# Patient Record
Sex: Female | Born: 1995 | Race: Black or African American | Hispanic: No | Marital: Single | Smoking: Never smoker
Health system: Southern US, Community
[De-identification: ages and names within clinical notes are randomized; demographics above are authoritative.]

## PROBLEM LIST (undated history)

## (undated) DIAGNOSIS — Z789 Other specified health status: Secondary | ICD-10-CM

## (undated) DIAGNOSIS — B999 Unspecified infectious disease: Secondary | ICD-10-CM

## (undated) DIAGNOSIS — R200 Anesthesia of skin: Secondary | ICD-10-CM

## (undated) DIAGNOSIS — A5901 Trichomonal vulvovaginitis: Secondary | ICD-10-CM

## (undated) DIAGNOSIS — A749 Chlamydial infection, unspecified: Secondary | ICD-10-CM

## (undated) DIAGNOSIS — R202 Paresthesia of skin: Secondary | ICD-10-CM

## (undated) HISTORY — DX: Anesthesia of skin: R20.0

## (undated) HISTORY — PX: BREAST SURGERY: SHX581

## (undated) HISTORY — DX: Paresthesia of skin: R20.2

---

## 2014-10-03 ENCOUNTER — Encounter (HOSPITAL_COMMUNITY): Payer: Self-pay | Admitting: Cardiology

## 2014-10-03 ENCOUNTER — Emergency Department (HOSPITAL_COMMUNITY)
Admission: EM | Admit: 2014-10-03 | Discharge: 2014-10-03 | Disposition: A | Discharge status: Home / Self Care | Payer: Medicaid Other | Attending: Emergency Medicine | Admitting: Emergency Medicine

## 2014-10-03 DIAGNOSIS — Z79899 Other long term (current) drug therapy: Secondary | ICD-10-CM | POA: Insufficient documentation

## 2014-10-03 DIAGNOSIS — Z792 Long term (current) use of antibiotics: Secondary | ICD-10-CM | POA: Diagnosis not present

## 2014-10-03 DIAGNOSIS — Z880 Allergy status to penicillin: Secondary | ICD-10-CM | POA: Diagnosis not present

## 2014-10-03 DIAGNOSIS — Z3202 Encounter for pregnancy test, result negative: Secondary | ICD-10-CM | POA: Insufficient documentation

## 2014-10-03 DIAGNOSIS — R509 Fever, unspecified: Secondary | ICD-10-CM | POA: Insufficient documentation

## 2014-10-03 DIAGNOSIS — R109 Unspecified abdominal pain: Secondary | ICD-10-CM | POA: Diagnosis not present

## 2014-10-03 LAB — COMPREHENSIVE METABOLIC PANEL
ALK PHOS: 84 U/L (ref 39–117)
ALT: 13 U/L (ref 0–35)
AST: 24 U/L (ref 0–37)
Albumin: 4.1 g/dL (ref 3.5–5.2)
Anion gap: 11 (ref 5–15)
BILIRUBIN TOTAL: 1 mg/dL (ref 0.3–1.2)
BUN: <5 mg/dL — ABNORMAL LOW (ref 6–23)
CHLORIDE: 104 mmol/L (ref 96–112)
CO2: 24 mmol/L (ref 19–32)
Calcium: 9.6 mg/dL (ref 8.4–10.5)
Creatinine, Ser: 0.78 mg/dL (ref 0.50–1.10)
GLUCOSE: 123 mg/dL — AB (ref 70–99)
POTASSIUM: 3.4 mmol/L — AB (ref 3.5–5.1)
SODIUM: 139 mmol/L (ref 135–145)
Total Protein: 7 g/dL (ref 6.0–8.3)

## 2014-10-03 LAB — URINALYSIS, ROUTINE W REFLEX MICROSCOPIC
Bilirubin Urine: NEGATIVE
Glucose, UA: NEGATIVE mg/dL
Hgb urine dipstick: NEGATIVE
Ketones, ur: >80 mg/dL — AB
Leukocytes, UA: NEGATIVE
NITRITE: NEGATIVE
Protein, ur: NEGATIVE mg/dL
SPECIFIC GRAVITY, URINE: 1.017 (ref 1.005–1.030)
Urobilinogen, UA: 1 mg/dL (ref 0.0–1.0)
pH: 7.5 (ref 5.0–8.0)

## 2014-10-03 LAB — CBC WITH DIFFERENTIAL/PLATELET
Basophils Absolute: 0 10*3/uL (ref 0.0–0.1)
Basophils Relative: 0 % (ref 0–1)
EOS ABS: 0 10*3/uL (ref 0.0–0.7)
Eosinophils Relative: 0 % (ref 0–5)
HEMATOCRIT: 37.1 % (ref 36.0–46.0)
Hemoglobin: 12.9 g/dL (ref 12.0–15.0)
LYMPHS ABS: 1.7 10*3/uL (ref 0.7–4.0)
LYMPHS PCT: 17 % (ref 12–46)
MCH: 31.5 pg (ref 26.0–34.0)
MCHC: 34.8 g/dL (ref 30.0–36.0)
MCV: 90.5 fL (ref 78.0–100.0)
MONO ABS: 1.1 10*3/uL — AB (ref 0.1–1.0)
Monocytes Relative: 11 % (ref 3–12)
Neutro Abs: 7.1 10*3/uL (ref 1.7–7.7)
Neutrophils Relative %: 72 % (ref 43–77)
PLATELETS: 197 10*3/uL (ref 150–400)
RBC: 4.1 MIL/uL (ref 3.87–5.11)
RDW: 12.4 % (ref 11.5–15.5)
WBC: 9.9 10*3/uL (ref 4.0–10.5)

## 2014-10-03 LAB — POC URINE PREG, ED: PREG TEST UR: NEGATIVE

## 2014-10-03 MED ORDER — NAPROXEN 500 MG PO TABS: 500.0000 mg | ORAL_TABLET | Freq: Two times a day (BID) | ORAL | Status: DC

## 2014-10-03 NOTE — ED Provider Notes (Signed)
CSN: [id number]     Arrival date & time 10/03/14  1758 History   First MD Initiated Contact with Patient 10/03/14 1827     Chief Complaint  Patient presents with  . Flank Pain     (Consider location/radiation/quality/duration/timing/severity/associated sxs/prior Treatment) Patient is a 19 y.o. female presenting with flank pain. The history is provided by the patient.  Flank Pain This is a new problem. The current episode started in the past 7 days. The problem occurs constantly. The problem has been gradually worsening. Associated symptoms include abdominal pain, chills and a fever. Pertinent negatives include no nausea or vomiting.   Patricia Greer is a 19 y.o. female who presents to the ED with flank pain. She states she was dx with a UTI 4 days ago and started on Macrobid. She was also treated for bacterial vaginosis and yeast infection.  Her symptoms are worse and her doctor called her today and told her the culture shows that she needs a different antibiotic. Patient has not been to the pharmacy to get the Rx and not sure what it is.    History reviewed. No pertinent past medical history. History reviewed. No pertinent past surgical history. History reviewed. No pertinent family history. History  Substance Use Topics  . Smoking status: Never Smoker   . Smokeless tobacco: Not on file  . Alcohol Use: Yes   OB History    No data available     Review of Systems  Constitutional: Positive for fever and chills.  Gastrointestinal: Positive for abdominal pain. Negative for nausea and vomiting.  Genitourinary: Positive for flank pain.  all other systems negative    Allergies  Amoxicillin  Home Medications   Prior to Admission medications   Medication Sig Start Date End Date Taking? Authorizing Provider  fluconazole (DIFLUCAN) 150 MG tablet Take 150 mg by mouth daily.   Yes Historical Provider, MD  medroxyPROGESTERone (DEPO-PROVERA) 150 MG/ML injection Inject 150 mg into the  muscle every 3 (three) months.   Yes Historical Provider, MD  metroNIDAZOLE (FLAGYL) 500 MG tablet Take 500 mg by mouth 2 (two) times daily. 08/22/14  Yes Historical Provider, MD  metroNIDAZOLE (METROGEL) 0.75 % vaginal gel Place 1 Applicatorful vaginally 2 (two) times daily.   Yes Historical Provider, MD  naproxen (NAPROSYN) 500 MG tablet Take 1 tablet (500 mg total) by mouth 2 (two) times daily. 10/03/14   Hope Bunnie Pion, NP   BP 115/62 mmHg  Pulse 95  Temp(Src) 99.1 F (37.3 C)  Resp 12  Wt 118 lb 7 oz (53.723 kg)  SpO2 100% Physical Exam  Constitutional: She is oriented to person, place, and time. She appears well-developed and well-nourished. No distress.  HENT:  Head: Normocephalic.  Eyes: EOM are normal.  Neck: Neck supple.  Cardiovascular: Normal rate.   Pulmonary/Chest: Effort normal.  Abdominal: Soft. There is no tenderness. There is no CVA tenderness.  Musculoskeletal: Normal range of motion.       Back:  There is tenderness with palpation to the right lower lumbar area.   Neurological: She is alert and oriented to person, place, and time. No cranial nerve deficit.  Skin: Skin is warm and dry.  Psychiatric: She has a normal mood and affect. Her behavior is normal.  Nursing note and vitals reviewed.   ED Course  Procedures (including critical care time) Labs Review Results for orders placed or performed during the hospital encounter of 10/03/14 (from the past 24 hour(s))  CBC WITH DIFFERENTIAL  Status: Abnormal   Collection Time: 10/03/14  6:16 PM  Result Value Ref Range   WBC 9.9 4.0 - 10.5 K/uL   RBC 4.10 3.87 - 5.11 MIL/uL   Hemoglobin 12.9 12.0 - 15.0 g/dL   HCT 37.1 36.0 - 46.0 %   MCV 90.5 78.0 - 100.0 fL   MCH 31.5 26.0 - 34.0 pg   MCHC 34.8 30.0 - 36.0 g/dL   RDW 12.4 11.5 - 15.5 %   Platelets 197 150 - 400 K/uL   Neutrophils Relative % 72 43 - 77 %   Neutro Abs 7.1 1.7 - 7.7 K/uL   Lymphocytes Relative 17 12 - 46 %   Lymphs Abs 1.7 0.7 - 4.0 K/uL    Monocytes Relative 11 3 - 12 %   Monocytes Absolute 1.1 (H) 0.1 - 1.0 K/uL   Eosinophils Relative 0 0 - 5 %   Eosinophils Absolute 0.0 0.0 - 0.7 K/uL   Basophils Relative 0 0 - 1 %   Basophils Absolute 0.0 0.0 - 0.1 K/uL  Comprehensive metabolic panel     Status: Abnormal   Collection Time: 10/03/14  6:16 PM  Result Value Ref Range   Sodium 139 135 - 145 mmol/L   Potassium 3.4 (L) 3.5 - 5.1 mmol/L   Chloride 104 96 - 112 mmol/L   CO2 24 19 - 32 mmol/L   Glucose, Bld 123 (H) 70 - 99 mg/dL   BUN <5 (L) 6 - 23 mg/dL   Creatinine, Ser 0.78 0.50 - 1.10 mg/dL   Calcium 9.6 8.4 - 10.5 mg/dL   Total Protein 7.0 6.0 - 8.3 g/dL   Albumin 4.1 3.5 - 5.2 g/dL   AST 24 0 - 37 U/L   ALT 13 0 - 35 U/L   Alkaline Phosphatase 84 39 - 117 U/L   Total Bilirubin 1.0 0.3 - 1.2 mg/dL   GFR calc non Af Amer >90 >90 mL/min   GFR calc Af Amer >90 >90 mL/min   Anion gap 11 5 - 15  Urinalysis with microscopic     Status: Abnormal   Collection Time: 10/03/14  6:33 PM  Result Value Ref Range   Color, Urine YELLOW YELLOW   APPearance CLEAR CLEAR   Specific Gravity, Urine 1.017 1.005 - 1.030   pH 7.5 5.0 - 8.0   Glucose, UA NEGATIVE NEGATIVE mg/dL   Hgb urine dipstick NEGATIVE NEGATIVE   Bilirubin Urine NEGATIVE NEGATIVE   Ketones, ur >80 (A) NEGATIVE mg/dL   Protein, ur NEGATIVE NEGATIVE mg/dL   Urobilinogen, UA 1.0 0.0 - 1.0 mg/dL   Nitrite NEGATIVE NEGATIVE   Leukocytes, UA NEGATIVE NEGATIVE  POC Urine Preg, ED (do not order at [facility])     Status: None   Collection Time: 10/03/14  7:02 PM  Result Value Ref Range   Preg Test, Ur NEGATIVE NEGATIVE    MDM  19 y.o. female with hx of UTI and current antibiotic treatment. Urine here tonight is normal other than >80 ketones. Stable for d/c without hematuria or signs of kidney stone. No fever, no CVA tenderness or signs of pyelo. Probably muscle strain.  Discussed with the patient and all questioned fully answered. She will follow up with her doctor or  return here if any problems arise. Naprosyn for pain and inflammation.   Final diagnoses:  Flank pain      Er[place], NP 10/04/14 0009  Dorie Rank, MD 10/06/14 [phone]

## 2014-10-03 NOTE — Discharge Instructions (Signed)
Your urine tonight shows no blood, you do appear to be slightly dehydrated. You blood work is normal. You will need to get the medication your doctor has prescribed for you and take ibuprofen for the pain. Return here as needed.

## 2014-10-03 NOTE — ED Notes (Signed)
Pt reports that she was dx with a UTI and is taking macrobid but the pain has become worse. Pain is now up into her flank area.

## 2018-04-27 ENCOUNTER — Emergency Department (HOSPITAL_COMMUNITY)
Admission: EM | Admit: 2018-04-27 | Discharge: 2018-04-27 | Disposition: A | Discharge status: Home / Self Care | Payer: Self-pay | Attending: Emergency Medicine | Admitting: Emergency Medicine

## 2018-04-27 ENCOUNTER — Encounter (HOSPITAL_COMMUNITY): Payer: Self-pay | Admitting: *Deleted

## 2018-04-27 ENCOUNTER — Other Ambulatory Visit: Payer: Self-pay

## 2018-04-27 DIAGNOSIS — M436 Torticollis: Secondary | ICD-10-CM | POA: Insufficient documentation

## 2018-04-27 DIAGNOSIS — Z79899 Other long term (current) drug therapy: Secondary | ICD-10-CM | POA: Insufficient documentation

## 2018-04-27 MED ORDER — IBUPROFEN 200 MG PO TABS
600.0000 mg | ORAL_TABLET | Freq: Once | ORAL | Status: AC
  Administered 2018-04-27: 600 mg via ORAL
  Filled 2018-04-27: qty 3

## 2018-04-27 NOTE — ED Notes (Signed)
Patient complaining of pain on the right side back of her neck going down to the upper part of her back.

## 2018-04-27 NOTE — ED Triage Notes (Signed)
Pt reports right sided neck pain through the shoulder blade for about 3 days. Has taken tylenol and using icy hot without relief.

## 2018-04-27 NOTE — Discharge Instructions (Addendum)
Please return for any problem. Follow up with a regular care provider in 2-3 days. Use Ibuprofen (600 mg every 8 hours) as instructed.

## 2018-04-27 NOTE — ED Provider Notes (Signed)
[facility] DEPT Provider Note   CSN: [id number] Arrival date & time: 04/27/18  2110     History   Chief Complaint Chief Complaint  Patient presents with  . Neck Pain    HPI Patricia Greer is a 22 y.o. female.  22 year old female with prior medical history as detailed below presents for evaluation of right-sided neck and upper back pain.  Patient reports that the pain began about 4 days prior.  She reports heavy lifting at work that may have incited the initial strain.  She has taken Tylenol at home with minimal improvement in her symptoms.  She denies other complaint.  She denies fever, nausea, vomiting, chest pain, shortness of breath, or focal weakness.  The history is provided by the patient and medical records.  Illness  The current episode started more than 2 days ago. The problem occurs constantly. The problem has not changed since onset.Pertinent negatives include no chest pain, no abdominal pain, no headaches and no shortness of breath. Exacerbated by: movement of head and neck  Nothing relieves the symptoms. She has tried acetaminophen for the symptoms.    History reviewed. No pertinent past medical history.  There are no active problems to display for this patient.   History reviewed. No pertinent surgical history.   OB History   None      Home Medications    Prior to Admission medications   Medication Sig Start Date End Date Taking? Authorizing Provider  fluconazole (DIFLUCAN) 150 MG tablet Take 150 mg by mouth daily.    [provider]  medroxyPROGESTERone (DEPO-PROVERA) 150 MG/ML injection Inject 150 mg into the muscle every 3 (three) months.    [provider]  metroNIDAZOLE (FLAGYL) 500 MG tablet Take 500 mg by mouth 2 (two) times daily. 08/22/14   [provider]  metroNIDAZOLE (METROGEL) 0.75 % vaginal gel Place 1 Applicatorful vaginally 2 (two) times daily.    [provider]  naproxen  (NAPROSYN) 500 MG tablet Take 1 tablet (500 mg total) by mouth 2 (two) times daily. 10/03/14   Ashley Murrain, NP    Family History No family history on file.  Social History Social History   Tobacco Use  . Smoking status: Never Smoker  Substance Use Topics  . Alcohol use: Yes  . Drug use: No     Allergies   Amoxicillin   Review of Systems Review of Systems  Respiratory: Negative for shortness of breath.   Cardiovascular: Negative for chest pain.  Gastrointestinal: Negative for abdominal pain.  Neurological: Negative for headaches.  All other systems reviewed and are negative.    Physical Exam Updated Vital Signs BP 139/81 (BP Location: Left Arm)   Pulse 88   Temp (!) 97.5 F (36.4 C) (Oral)   Resp 16   Ht 5\' 4"  (1.626 m)   Wt 56.7 kg   LMP 04/16/2018   SpO2 100%   BMI 21.46 kg/m   Physical Exam  Constitutional: She is oriented to person, place, and time. She appears well-developed and well-nourished. No distress.  HENT:  Head: Normocephalic and atraumatic.  Mouth/Throat: Oropharynx is clear and moist.  Eyes: Pupils are equal, round, and reactive to light. Conjunctivae and EOM are normal.  Neck: Normal range of motion. Neck supple.  Cardiovascular: Normal rate, regular rhythm and normal heart sounds.  Pulmonary/Chest: Effort normal and breath sounds normal. No respiratory distress.  Abdominal: Soft. She exhibits no distension. There is no tenderness.  Musculoskeletal: Normal range  of motion. She exhibits no edema or deformity.  Mild tenderness to right lateral neck and posterior aspect of right shoulder   Neurological: She is alert and oriented to person, place, and time.  Skin: Skin is warm and dry.  Psychiatric: She has a normal mood and affect.  Nursing note and vitals reviewed.    ED Treatments / Results  Labs (all labs ordered are listed, but only abnormal results are displayed) Labs Reviewed - No data to display  EKG None  Radiology No  results found.  Procedures Procedures (including critical care time)  Medications Ordered in ED Medications  ibuprofen (ADVIL,MOTRIN) tablet 600 mg (has no administration in time range)     Initial Impression / Assessment and Plan / ED Course  I have reviewed the triage vital signs and the nursing notes.  Pertinent labs & imaging results that were available during my care of the patient were reviewed by me and considered in my medical decision making (see chart for details).     MDM  Screen complete    Patient presentation is consistent with likely mild torticollis.  Patient without evidence of other acute pathology.  Patient feels improved following her ED evaluation.    Patient advised to include ibuprofen at home for treatment of her muscle pain. She was also provided with a work note.   She understands the need for close follow-up. Strict return instructions given and understood.   Final Clinical Impressions(s) / ED Diagnoses   Final diagnoses:  Torticollis, acute    ED Discharge Orders    None       Valarie Merino, MD 04/27/18 2148

## 2018-06-14 NOTE — L&D Delivery Note (Signed)
Delivery Note Labor onset: 05/28/2019  Labor Onset Time: 1800 Complete dilation at 10:36 PM  Onset of pushing at 2245 FHR second stage Cat 1 then Cat 3 Analgesia/Anesthesia intrapartum: Epidural  Guided pushing with maternal urge. FHR to 80's, then 70's 6 min, maternal position change to to left lateral side, FHR ascending to 110's. Decel to 80's, maternal position change to right side, assistance from faculty MD for operative birth. FHR ascending to 110's on right side. Dr. Mariane Masters to bedside, Kiwi vac applied, automatic release of vac x1. Kiwi re-applied, fetal head delivered w/ force of one ctx over midline epis.  Delivery of a viable female at 2307. Fetal head delivered in direct OA position and restituted to LOA. Nuchal cord x1, loose and reduced on perineum. Body cord x1. Infant placed on maternal abd, dried, and tactile stim. NICU present, vigorous infant to remain w/ mother.  Cord double clamped and cut by Fraser Din, father.  Cord blood sample collected Arterial cord blood sample N/A.  Placenta delivered Delena Bali, intact, with 3 VC.  Placenta to L&D. Uterine tone firm bleeding small  Midline episiotomy w/o extension identified.  Anesthesia: Epidural and Lidocaine 1% Repair: 2-0 Vicryl CT, 4-0 Vicryl SH QBL/EBL (mL): 84 Complications: fetal bradycardia APGAR: APGAR (1 MIN): 8   APGAR (5 MINS): 9   APGAR (10 MINS):   Mom to postpartum.  Baby to Couplet care / Skin to Skin.  Arrie Eastern MSN, CNM 05/29/2019, 12:08 AM

## 2018-09-26 ENCOUNTER — Emergency Department (HOSPITAL_COMMUNITY)
Admission: EM | Admit: 2018-09-26 | Discharge: 2018-09-26 | Disposition: A | Discharge status: Home / Self Care | Payer: Medicaid Other | Attending: Emergency Medicine | Admitting: Emergency Medicine

## 2018-09-26 ENCOUNTER — Emergency Department (HOSPITAL_COMMUNITY): Payer: Medicaid Other

## 2018-09-26 ENCOUNTER — Encounter (HOSPITAL_COMMUNITY): Payer: Self-pay | Admitting: *Deleted

## 2018-09-26 DIAGNOSIS — O9989 Other specified diseases and conditions complicating pregnancy, childbirth and the puerperium: Secondary | ICD-10-CM | POA: Diagnosis present

## 2018-09-26 DIAGNOSIS — R1013 Epigastric pain: Secondary | ICD-10-CM | POA: Diagnosis not present

## 2018-09-26 DIAGNOSIS — Z79899 Other long term (current) drug therapy: Secondary | ICD-10-CM | POA: Insufficient documentation

## 2018-09-26 DIAGNOSIS — O3461 Maternal care for abnormality of vagina, first trimester: Secondary | ICD-10-CM | POA: Insufficient documentation

## 2018-09-26 DIAGNOSIS — R1033 Periumbilical pain: Secondary | ICD-10-CM | POA: Diagnosis not present

## 2018-09-26 DIAGNOSIS — N898 Other specified noninflammatory disorders of vagina: Secondary | ICD-10-CM | POA: Diagnosis not present

## 2018-09-26 DIAGNOSIS — Z3491 Encounter for supervision of normal pregnancy, unspecified, first trimester: Secondary | ICD-10-CM

## 2018-09-26 DIAGNOSIS — R109 Unspecified abdominal pain: Secondary | ICD-10-CM

## 2018-09-26 DIAGNOSIS — Z3A00 Weeks of gestation of pregnancy not specified: Secondary | ICD-10-CM | POA: Insufficient documentation

## 2018-09-26 LAB — POCT I-STAT EG7
Acid-base deficit: 1 mmol/L (ref 0.0–2.0)
Bicarbonate: 24.3 mmol/L (ref 20.0–28.0)
Calcium, Ion: 1.22 mmol/L (ref 1.15–1.40)
HCT: 35 % — ABNORMAL LOW (ref 36.0–46.0)
Hemoglobin: 11.9 g/dL — ABNORMAL LOW (ref 12.0–15.0)
O2 Saturation: 63 %
Potassium: 3.6 mmol/L (ref 3.5–5.1)
Sodium: 137 mmol/L (ref 135–145)
TCO2: 26 mmol/L (ref 22–32)
pCO2, Ven: 42.1 mmHg — ABNORMAL LOW (ref 44.0–60.0)
pH, Ven: 7.368 (ref 7.250–7.430)
pO2, Ven: 34 mmHg (ref 32.0–45.0)

## 2018-09-26 LAB — CBC WITH DIFFERENTIAL/PLATELET
Abs Immature Granulocytes: 0.02 10*3/uL (ref 0.00–0.07)
Basophils Absolute: 0 10*3/uL (ref 0.0–0.1)
Basophils Relative: 0 %
Eosinophils Absolute: 0.1 10*3/uL (ref 0.0–0.5)
Eosinophils Relative: 1 %
HCT: 37.3 % (ref 36.0–46.0)
Hemoglobin: 12.5 g/dL (ref 12.0–15.0)
Immature Granulocytes: 0 %
Lymphocytes Relative: 25 %
Lymphs Abs: 2.3 10*3/uL (ref 0.7–4.0)
MCH: 32.6 pg (ref 26.0–34.0)
MCHC: 33.5 g/dL (ref 30.0–36.0)
MCV: 97.1 fL (ref 80.0–100.0)
Monocytes Absolute: 0.8 10*3/uL (ref 0.1–1.0)
Monocytes Relative: 8 %
Neutro Abs: 6.1 10*3/uL (ref 1.7–7.7)
Neutrophils Relative %: 66 %
Platelets: 209 10*3/uL (ref 150–400)
RBC: 3.84 MIL/uL — ABNORMAL LOW (ref 3.87–5.11)
RDW: 12.7 % (ref 11.5–15.5)
WBC: 9.4 10*3/uL (ref 4.0–10.5)
nRBC: 0 % (ref 0.0–0.2)

## 2018-09-26 LAB — COMPREHENSIVE METABOLIC PANEL
ALT: 17 U/L (ref 0–44)
AST: 18 U/L (ref 15–41)
Albumin: 3.9 g/dL (ref 3.5–5.0)
Alkaline Phosphatase: 74 U/L (ref 38–126)
Anion gap: 6 (ref 5–15)
BUN: 8 mg/dL (ref 6–20)
CO2: 24 mmol/L (ref 22–32)
Calcium: 9.1 mg/dL (ref 8.9–10.3)
Chloride: 107 mmol/L (ref 98–111)
Creatinine, Ser: 0.64 mg/dL (ref 0.44–1.00)
GFR calc Af Amer: >60 mL/min (ref >60)
GFR calc non Af Amer: >60 mL/min (ref >60)
Glucose, Bld: 74 mg/dL (ref 70–99)
Potassium: 3.6 mmol/L (ref 3.5–5.1)
Sodium: 137 mmol/L (ref 135–145)
Total Bilirubin: 0.2 mg/dL — ABNORMAL LOW (ref 0.3–1.2)
Total Protein: 6.8 g/dL (ref 6.5–8.1)

## 2018-09-26 LAB — I-STAT BETA HCG BLOOD, ED (MC, WL, AP ONLY): I-stat hCG, quantitative: >2000 m[IU]/mL — ABNORMAL HIGH (ref <5)

## 2018-09-26 LAB — WET PREP, GENITAL
Clue Cells Wet Prep HPF POC: NONE SEEN
Sperm: NONE SEEN
Trich, Wet Prep: NONE SEEN
Yeast Wet Prep HPF POC: NONE SEEN

## 2018-09-26 LAB — URINALYSIS, ROUTINE W REFLEX MICROSCOPIC
Bilirubin Urine: NEGATIVE
Glucose, UA: NEGATIVE mg/dL
Hgb urine dipstick: NEGATIVE
Ketones, ur: NEGATIVE mg/dL
Leukocytes,Ua: NEGATIVE
Nitrite: NEGATIVE
Protein, ur: NEGATIVE mg/dL
Specific Gravity, Urine: 1.019 (ref 1.005–1.030)
pH: 6 (ref 5.0–8.0)

## 2018-09-26 LAB — LIPASE, BLOOD: Lipase: 34 U/L (ref 11–51)

## 2018-09-26 LAB — PREGNANCY, URINE: Preg Test, Ur: POSITIVE — AB

## 2018-09-26 LAB — HCG, QUANTITATIVE, PREGNANCY: hCG, Beta Chain, Quant, S: 5760 m[IU]/mL — ABNORMAL HIGH (ref <5)

## 2018-09-26 MED ORDER — LIDOCAINE VISCOUS HCL 2 % MT SOLN
15.0000 mL | Freq: Once | OROMUCOSAL | Status: AC
  Administered 2018-09-26: 15 mL via ORAL
  Filled 2018-09-26: qty 15

## 2018-09-26 MED ORDER — FAMOTIDINE 20 MG PO TABS
20.0000 mg | ORAL_TABLET | Freq: Once | ORAL | Status: AC
  Administered 2018-09-26: 20 mg via ORAL
  Filled 2018-09-26: qty 1

## 2018-09-26 MED ORDER — ALUM & MAG HYDROXIDE-SIMETH 200-200-20 MG/5ML PO SUSP
30.0000 mL | Freq: Once | ORAL | Status: AC
  Administered 2018-09-26: 30 mL via ORAL
  Filled 2018-09-26: qty 30

## 2018-09-26 MED ORDER — PRENATAL COMPLETE 14-0.4 MG PO TABS: 1.0000 | ORAL_TABLET | Freq: Every day | ORAL | 0 refills | Status: DC

## 2018-09-26 MED ORDER — SODIUM CHLORIDE 0.9 % IV BOLUS
1000.0000 mL | Freq: Once | INTRAVENOUS | Status: AC
  Administered 2018-09-26: 1000 mL via INTRAVENOUS

## 2018-09-26 NOTE — ED Triage Notes (Signed)
Pt complains of abdominal pain for the past 2 weeks. Pain is worse at night. Pt denies n/v/d. Pt took pregnancy test yesterday, which was negative.

## 2018-09-26 NOTE — ED Provider Notes (Signed)
Pt care assumed at 1700.  Pt here for 2 weeks of intermittent central abdominal discomfort.  Pelvic US without identifiable IUP.  D/w pt findings of studies and importance of outpatient follow up. Return precautions discussed.    Discussed with OB/GYN on-call, will refer for follow-up quantity in 48 hours.   Quintella Reichert, MD 09/26/18 [phone]

## 2018-09-26 NOTE — Discharge Instructions (Addendum)
Take prenatal vitamins daily.   See OB for follow up   Return to ER if you have worse abdominal pain, vomiting, fever   You can take tylenol, available over the counter according to label instructions as needed for pain.

## 2018-09-26 NOTE — ED Notes (Signed)
U/S was at bedside.

## 2018-09-26 NOTE — ED Provider Notes (Signed)
[facility] DEPT Provider Note   CSN: [id number] Arrival date & time: 09/26/18  1437    History   Chief Complaint Chief Complaint  Patient presents with  . Abdominal Pain    HPI Patricia Greer is a 23 y.o. female otherwise healthy here presenting with abdominal pain, cramps.  Patient states that for the last 2 weeks she has intermittent epigastric and periumbilical pain.  Pain is not worse with eating but is worse at night.  Denies any nausea vomiting or diarrhea.  Patient states that she was not sure if she was late for her menstrual cycle and took a pregnancy test yesterday that was negative.  States that she is sexually active with only one partner and has no vaginal discharge. She states that she was tested negative for STD recently.      The history is provided by the patient.    History reviewed. No pertinent past medical history.  There are no active problems to display for this patient.   History reviewed. No pertinent surgical history.   OB History   No obstetric history on file.      Home Medications    Prior to Admission medications   Medication Sig Start Date End Date Taking? Authorizing Provider  fluconazole (DIFLUCAN) 150 MG tablet Take 150 mg by mouth daily.    [provider]  medroxyPROGESTERone (DEPO-PROVERA) 150 MG/ML injection Inject 150 mg into the muscle every 3 (three) months.    [provider]  metroNIDAZOLE (FLAGYL) 500 MG tablet Take 500 mg by mouth 2 (two) times daily. 08/22/14   [provider]  metroNIDAZOLE (METROGEL) 0.75 % vaginal gel Place 1 Applicatorful vaginally 2 (two) times daily.    [provider]  naproxen (NAPROSYN) 500 MG tablet Take 1 tablet (500 mg total) by mouth 2 (two) times daily. 10/03/14   Ashley Murrain, NP  Prenatal Vit-Fe Fumarate-FA (PRENATAL COMPLETE) 14-0.4 MG TABS Take 1 tablet by mouth daily. 09/26/18   Drenda Freeze, MD    Family History No  family history on file.  Social History Social History   Tobacco Use  . Smoking status: Never Smoker  . Smokeless tobacco: Never Used  Substance Use Topics  . Alcohol use: Yes  . Drug use: No     Allergies   Amoxicillin   Review of Systems Review of Systems  Gastrointestinal: Positive for abdominal pain.  All other systems reviewed and are negative.    Physical Exam Updated Vital Signs BP 129/81 (BP Location: Left Arm)   Pulse (!) 104   Temp (!) 97.5 F (36.4 C) (Oral)   Resp 18   LMP 08/27/2018   SpO2 100%   Physical Exam Vitals signs and nursing note reviewed.  Constitutional:      Appearance: She is well-developed.  HENT:     Head: Normocephalic.     Mouth/Throat:     Mouth: Mucous membranes are moist.  Eyes:     Extraocular Movements: Extraocular movements intact.  Cardiovascular:     Rate and Rhythm: Normal rate and regular rhythm.     Heart sounds: Normal heart sounds.  Pulmonary:     Effort: Pulmonary effort is normal.     Breath sounds: Normal breath sounds.  Abdominal:     General: Abdomen is flat.     Comments: Minimal epigastric and periumbilical tenderness, no lower abdominal tenderness   Genitourinary:    Vagina: Normal.     Cervix: No cervical motion  tenderness.     Uterus: Normal.      Adnexa: Right adnexa normal and left adnexa normal.     Comments: Whitish discharge, no CMT or adnexal tenderness  Skin:    General: Skin is warm.     Capillary Refill: Capillary refill takes less than 2 seconds.  Neurological:     General: No focal deficit present.     Mental Status: She is alert.  Psychiatric:        Mood and Affect: Mood normal.        Behavior: Behavior normal.      ED Treatments / Results  Labs (all labs ordered are listed, but only abnormal results are displayed) Labs Reviewed  WET PREP, GENITAL - Abnormal; Notable for the following components:      Result Value   WBC, Wet Prep HPF POC FEW (*)    All other components  within normal limits  CBC WITH DIFFERENTIAL/PLATELET - Abnormal; Notable for the following components:   RBC 3.84 (*)    All other components within normal limits  COMPREHENSIVE METABOLIC PANEL - Abnormal; Notable for the following components:   Total Bilirubin 0.2 (*)    All other components within normal limits  PREGNANCY, URINE - Abnormal; Notable for the following components:   Preg Test, Ur POSITIVE (*)    All other components within normal limits  I-STAT BETA HCG BLOOD, ED ([facility], [facility], [facility] ONLY) - Abnormal; Notable for the following components:   I-stat hCG, quantitative >2,000.0 (*)    All other components within normal limits  POCT I-STAT EG7 - Abnormal; Notable for the following components:   pCO2, Ven 42.1 (*)    HCT 35.0 (*)    Hemoglobin 11.9 (*)    All other components within normal limits  LIPASE, BLOOD  URINALYSIS, ROUTINE W REFLEX MICROSCOPIC  GC/CHLAMYDIA PROBE AMP ([place]) NOT AT [facility]    EKG None  Radiology No results found.  Procedures Procedures (including critical care time)  Angiocath insertion Performed by: Wandra Arthurs  Consent: Verbal consent obtained. Risks and benefits: risks, benefits and alternatives were discussed Time out: Immediately prior to procedure a "time out" was called to verify the correct patient, procedure, equipment, support staff and site/side marked as required.  Preparation: Patient was prepped and draped in the usual sterile fashion.  Vein Location: L antecube  Ultrasound Guided  Gauge: 20   Normal blood return and flush without difficulty Patient tolerance: Patient tolerated the procedure well with no immediate complications.     Medications Ordered in ED Medications  sodium chloride 0.9 % bolus 1,000 mL (1,000 mLs Intravenous New Bag/Given 09/26/18 1618)  famotidine (PEPCID) tablet 20 mg (20 mg Oral Given 09/26/18 1614)  alum & mag hydroxide-simeth (MAALOX/MYLANTA) 200-200-20 MG/5ML suspension 30 mL (30 mLs Oral  Given 09/26/18 1615)    And  lidocaine (XYLOCAINE) 2 % viscous mouth solution 15 mL (15 mLs Oral Given 09/26/18 1615)     Initial Impression / Assessment and Plan / ED Course  I have reviewed the triage vital signs and the nursing notes.  Pertinent labs & imaging results that were available during my care of the patient were reviewed by me and considered in my medical decision making (see chart for details).       Patricia Greer is a 23 y.o. female here with abdominal pain for 2 weeks. Likely constipation. Patient may be late for her period so will get pregnancy test. Pelvic exam unremarkable and she  is in a monogamous relationship. Will get labs, UA, acute abdominal series.   4:58 PM UCG and istat HCG positive. Bedside US unable to find IUP. Ordered transvag US. Signed out to Dr. Ralene Bathe in the ED to follow up US results. Anticipate dc home with prenatal vitamins, OB follow up if ultrasound showed live IUP.    Final Clinical Impressions(s) / ED Diagnoses   Final diagnoses:  First trimester pregnancy    ED Discharge Orders         Ordered    Prenatal Vit-Fe Fumarate-FA (PRENATAL COMPLETE) 14-0.4 MG TABS  Daily     09/26/18 1651           Drenda Freeze, MD 09/26/18 [phone]

## 2018-09-27 ENCOUNTER — Telehealth: Payer: Self-pay | Admitting: Medical

## 2018-09-27 LAB — GC/CHLAMYDIA PROBE AMP (~~LOC~~) NOT AT ARMC
Chlamydia: NEGATIVE
Neisseria Gonorrhea: NEGATIVE

## 2018-09-27 NOTE — Telephone Encounter (Signed)
Received in-basket message from EDP to schedule 48 hour follow-up hCG lab draw for PUL. Chart reviewed. Appointment made at [facility] for patient with appointment information and to call back with any questions.   Luvenia Redden, PA-C 09/27/2018 12:53 PM

## 2018-09-29 ENCOUNTER — Ambulatory Visit (INDEPENDENT_AMBULATORY_CARE_PROVIDER_SITE_OTHER): Payer: Self-pay | Admitting: *Deleted

## 2018-09-29 ENCOUNTER — Inpatient Hospital Stay (HOSPITAL_COMMUNITY)
Admission: AD | Admit: 2018-09-29 | Discharge: 2018-09-29 | Disposition: A | Discharge status: Home / Self Care | Payer: Medicaid Other | Attending: Obstetrics and Gynecology | Admitting: Obstetrics and Gynecology

## 2018-09-29 ENCOUNTER — Inpatient Hospital Stay (HOSPITAL_COMMUNITY): Payer: Medicaid Other

## 2018-09-29 ENCOUNTER — Encounter (HOSPITAL_COMMUNITY): Payer: Self-pay | Admitting: *Deleted

## 2018-09-29 ENCOUNTER — Other Ambulatory Visit: Payer: Self-pay

## 2018-09-29 DIAGNOSIS — O26891 Other specified pregnancy related conditions, first trimester: Secondary | ICD-10-CM | POA: Diagnosis present

## 2018-09-29 DIAGNOSIS — O3680X0 Pregnancy with inconclusive fetal viability, not applicable or unspecified: Secondary | ICD-10-CM

## 2018-09-29 DIAGNOSIS — Z3A01 Less than 8 weeks gestation of pregnancy: Secondary | ICD-10-CM | POA: Diagnosis not present

## 2018-09-29 DIAGNOSIS — R102 Pelvic and perineal pain: Secondary | ICD-10-CM | POA: Diagnosis not present

## 2018-09-29 DIAGNOSIS — R109 Unspecified abdominal pain: Secondary | ICD-10-CM

## 2018-09-29 HISTORY — DX: Unspecified infectious disease: B99.9

## 2018-09-29 HISTORY — DX: Other specified health status: Z78.9

## 2018-09-29 HISTORY — DX: Trichomonal vulvovaginitis: A59.01

## 2018-09-29 HISTORY — DX: Chlamydial infection, unspecified: A74.9

## 2018-09-29 LAB — BETA HCG QUANT (REF LAB): hCG Quant: 9836 m[IU]/mL

## 2018-09-29 NOTE — Discharge Instructions (Signed)

## 2018-09-29 NOTE — Progress Notes (Addendum)
Pt seen @ [facility] on 4/14 with abdominal cramping and no bleeding. BHCG was 5,760. [place] did not show IUP. She returns today for follow up stat BHCG. She has continued to have abdominal cramping - pain scale 3 today. She denies having any bleeding. She has taken some tylenol over the last few days with good resolve of cramping. Stat BHCG drawn and pt will wait for results.   1210  BHCG results (9,836) and pt medical record reviewed by Dr. Gala Romney. Pt was notified of results and recommendation for follow up @ [facility] today in order to have follow up US. Pt voiced understanding and agreed to plan of care. Report was called to [facility] by Dr. Gala Romney.

## 2018-09-29 NOTE — Progress Notes (Signed)
I have reviewed the chart and agree with nursing staff's documentation of this patient's encounter.  Silas Sacramento, MD 09/29/2018 3:44 PM

## 2018-09-29 NOTE — MAU Provider Note (Signed)
Subjective:  Patricia Greer is a 23 y.o. G1P0 at Unknown who presents today for FU BHCG. She was seen on 4/17 in the clinic and was sent here for an [place]. Results from that day show no IUP on [place], and HCG 9,836. Hcg 4/14 was 5760.  She denies vaginal bleeding. She reports abdominal or pelvic pain.   Objective:  Physical Exam  Nursing note and vitals reviewed. Constitutional: She is oriented to person, place, and time. She appears well-developed and well-nourished. No distress.  HENT:  Head: Normocephalic.  ABDOMEN: soft, generalized tenderness, No rebound.  Cardiovascular: Normal rate.  Respiratory: Effort normal.  GI: Soft. There is no tenderness.  Neurological: She is alert and oriented to person, place, and time. Skin: Skin is warm and dry.  Psychiatric: She has a normal mood and affect.   Results for orders placed or performed in visit on 09/29/18 (from the past 24 hour(s))  Beta hCG quant (ref lab)     Status: None   Collection Time: 09/29/18  9:26 AM  Result Value Ref Range   hCG Quant 9,836 mIU/mL   Narrative   Performed at:  [facility] [address]  [place], [place], [place]  [id number] Lab Director: Lindon Romp MD, Phone:  [phone]   US Ob Transvaginal  Result Date: 09/29/2018 CLINICAL DATA:  23 year old pregnant female presents with abdominal pain. Quantitative beta HCG 9,836. EDC by LMP: 06/03/2019, projecting to an expected gestational age of [redacted] weeks 5 days. EXAM: TRANSVAGINAL OB ULTRASOUND TECHNIQUE: Transvaginal ultrasound was performed for complete evaluation of the gestation as well as the maternal uterus, adnexal regions, and pelvic cul-de-sac. COMPARISON:  09/26/2018 obstetric scan. FINDINGS: Intrauterine gestational sac: Single intrauterine gestational sac eccentrically located in the right uterine horn. Yolk sac:  Visualized. Embryo:  Not Visualized. Cardiac Activity: Not Visualized. MSD: 6.4 mm   5 w   2 d Subchorionic hemorrhage:  None  visualized. Maternal uterus/adnexae: Right ovary measures 3.0 x 1.4 x 2.3 cm. Left ovary measures 2.1 x 2.1 x 2.1 cm. No abnormal ovarian or adnexal masses. Small volume simple free fluid in the pelvic cul-de-sac. IMPRESSION: 1. Single intrauterine gestational sac with yolk sac at 5 weeks 2 days by mean sac diameter. No embryo detected, which could be due to early gestational age. Recommend follow-up quantitative B-HCG levels and follow-up US in 11-14 days to assess viability. This recommendation follows SRU consensus guidelines: Diagnostic Criteria for Nonviable Pregnancy Early in the First Trimester. [employer] Med 2013; 664:4034-74. 2. No adnexal abnormality. 3. Nonspecific small volume simple free fluid in the pelvic cul-de-sac. Electronically Signed   By: Ilona Sorrel M.D.   On: 09/29/2018 14:13   Assessment/Plan:  SIUP @ [redacted]w[redacted]d HCG did rise appropriately US shows IUGS and Yolk sac  FU as needed, call office to schedule OB visit.

## 2018-09-29 NOTE — MAU Note (Signed)
Sent from clinic.  Had rpt BHCG level today, cramping at times, none currently, has woke her during the night.  No bleeding.

## 2018-10-02 ENCOUNTER — Telehealth: Payer: Self-pay | Admitting: *Deleted

## 2018-10-02 NOTE — Telephone Encounter (Signed)
Pt calling because she is [redacted] weeks pregnant and has been experiencing spotting.  She reports that the spotting started Friday and she had an ultrasound on Thursday.  She reports she was checked last week for stomach pains and wants to know if she is ok or if she needs to go to the hospital.  Pt states she feels it is implantation bleeding.  Pt requesting call back from the nurse.

## 2018-10-02 NOTE — Telephone Encounter (Signed)
Attempted to return pt's call but pt did not pick up.  Left message informing pt that she was being contacted in regards to her call to the clinic and requesting that she contact the clinic if she continues to have questions or concerns.

## 2018-10-03 NOTE — Telephone Encounter (Signed)
Returned pt's call regarding spotting.  Pt reports it was not heavy but very light and stopped yesterday.  Pt denies any other questions or concerns.  Advised pt that there can be some spotting following anything vaginal d/t sensitivity of the cervix during pregnancy but if her bleeding becomes heavy like a period or is prolonged then she should contact the office.  Pt verbalized understanding.

## 2018-12-27 LAB — OB RESULTS CONSOLE HIV ANTIBODY (ROUTINE TESTING): HIV: NONREACTIVE

## 2018-12-27 LAB — OB RESULTS CONSOLE GC/CHLAMYDIA
Chlamydia: NEGATIVE
Gonorrhea: NEGATIVE

## 2018-12-27 LAB — OB RESULTS CONSOLE RUBELLA ANTIBODY, IGM: Rubella: IMMUNE

## 2018-12-27 LAB — OB RESULTS CONSOLE HEPATITIS B SURFACE ANTIGEN: Hepatitis B Surface Ag: NEGATIVE

## 2018-12-27 LAB — OB RESULTS CONSOLE RPR: RPR: NONREACTIVE

## 2019-01-15 LAB — OB RESULTS CONSOLE ABO/RH: RH Type: POSITIVE

## 2019-01-15 LAB — OB RESULTS CONSOLE ANTIBODY SCREEN: Antibody Screen: NEGATIVE

## 2019-04-16 ENCOUNTER — Encounter: Payer: Self-pay | Admitting: Neurology

## 2019-04-16 ENCOUNTER — Ambulatory Visit: Payer: Medicaid Other | Admitting: Neurology

## 2019-04-16 ENCOUNTER — Other Ambulatory Visit: Payer: Self-pay

## 2019-04-16 ENCOUNTER — Telehealth: Payer: Self-pay | Admitting: Neurology

## 2019-04-16 VITALS — BP 119/79 | HR 100 | Temp 97.6°F | Ht 64.0 in | Wt 171.0 lb

## 2019-04-16 DIAGNOSIS — R200 Anesthesia of skin: Secondary | ICD-10-CM

## 2019-04-16 NOTE — Progress Notes (Signed)
PATIENT: Patricia Greer DOB: 1995-08-09  Chief Complaint  Patient presents with  . Paresthesia of skin    She is currenlty pregnant with her first child (son).  EDD of 06/01/2019.  Reports numbness/tingling in left arm from elbow down to wrist.  She also feels her arm is weak.   Patricia Raring, MD (referring provider - no PCP per patient)     HISTORICAL  Patricia Greer is a 23 year old female, seen in request by her OB/GYN Dr.Kulwa, Patricia Greer for evaluation of left arm numbness, initial evaluation was on April 16, 2019.  I have reviewed and summarized the referring note from the referring physician.  She is currently [redacted] weeks pregnant, expected due date is on June 01, 2019.  In August 2020, doing her regular OB/GYN evaluation, she had the blood drawn through left median cubital vein, she denies significant abnormality when she had the blood draw, but next day, she woke up noticed left arm numbness from left antecubital fossa spreading to left anterior forearm, involving left hand, at the same time, she noticed deep heaviness uncomfortable sensation, mild weakness of her left arm, symptoms has been present intermittently since his onset, she denies left leg involvement, no left facial weakness, no speech difficulty, denies previous history of loss of vision  REVIEW OF SYSTEMS: Full 14 system review of systems performed and notable only for as above All other review of systems were negative.  ALLERGIES: Allergies  Allergen Reactions  . Amoxicillin Other (See Comments)    Severe yeast infection    HOME MEDICATIONS: Current Outpatient Medications  Medication Sig Dispense Refill  . Prenatal Vit-Fe Fumarate-FA (PRENATAL COMPLETE) 14-0.4 MG TABS Take 1 tablet by mouth daily. 60 each 0   No current facility-administered medications for this visit.     PAST MEDICAL HISTORY: Past Medical History:  Diagnosis Date  . Chlamydia   . Infection    UTI  . Medical history  non-contributory   . Numbness and tingling   . Trichomonas vaginitis     PAST SURGICAL HISTORY: Past Surgical History:  Procedure Laterality Date  . BREAST SURGERY Right    removed fibroid cyst    FAMILY HISTORY: Family History  Problem Relation Age of Onset  . Anxiety disorder Patricia Greer   . Lupus Patricia Greer   . Healthy Patricia Greer     SOCIAL HISTORY: Social History   Socioeconomic History  . Marital status: Single    Spouse name: Not on file  . Number of children: 0  . Years of education: college  . Highest education level: Bachelor's degree (e.g., BA, AB, BS)  Occupational History  . Occupation: Patricia Greer  . Financial resource strain: Not on file  . Food insecurity    Worry: Not on file    Inability: Not on file  . Transportation needs    Medical: Not on file    Non-medical: Not on file  Tobacco Use  . Smoking status: Never Smoker  . Smokeless tobacco: Never Used  Substance and Sexual Activity  . Alcohol use: Not Currently    Comment: not while pregnant  . Drug use: No  . Sexual activity: Yes  Lifestyle  . Physical activity    Days per week: Not on file    Minutes per session: Not on file  . Stress: Not on file  Relationships  . Social Herbalist on phone: Not on file    Gets together: Not on  file    Attends religious service: Not on file    Active member of club or organization: Not on file    Attends meetings of clubs or organizations: Not on file    Relationship status: Not on file  . Intimate partner violence    Fear of current or ex partner: Not on file    Emotionally abused: Not on file    Physically abused: Not on file    Forced sexual activity: Not on file  Other Topics Concern  . Not on file  Social History Narrative   Lives at home with her boyfriend and brother.     No caffeine use.   Right-handed.     PHYSICAL EXAM   Vitals:   04/16/19 1304  BP: 119/79  Pulse: 100  Temp: 97.6 F (36.4 C)  Weight: 171 lb (77.6 kg)   Height: 5\' 4"  (1.626 m)    Not recorded      Body mass index is 29.35 kg/m.  PHYSICAL EXAMNIATION:  Gen: NAD, conversant, well nourised, well groomed                     Cardiovascular: Regular rate rhythm, no peripheral edema, warm, nontender. Eyes: Conjunctivae clear without exudates or hemorrhage Neck: Supple, no carotid bruits. Pulmonary: Clear to auscultation bilaterally   NEUROLOGICAL EXAM:  MENTAL STATUS: Speech:    Speech is normal; fluent and spontaneous with normal comprehension.  Cognition:     Orientation to time, place and person     Normal recent and remote memory     Normal Attention span and concentration     Normal Language, naming, repeating,spontaneous speech     Fund of knowledge   CRANIAL NERVES: CN II: Visual fields are full to confrontation.  Pupils are round equal and briskly reactive to light. CN III, IV, VI: extraocular movement are normal. No ptosis. CN V: Facial sensation is intact to pinprick in all 3 divisions bilaterally. Corneal responses are intact.  CN VII: Face is symmetric with normal eye closure and smile. CN VIII: Hearing is normal to causal conversation. CN IX, X: Palate elevates symmetrically. Phonation is normal. CN XI: Head turning and shoulder shrug are intact CN XII: Tongue is midline with normal movements and no atrophy.  MOTOR: There is no pronation drift, mild fixation of left upper extremity on rapid rotating movement  REFLEXES: Reflexes are 2+ and symmetric at the biceps, triceps, 3/3 knees, and ankles. Plantar responses are flexor.  SENSORY: Intact to light touch, pinprick, positional sensation and vibratory sensation are intact in fingers and toes.  COORDINATION: Rapid alternating movements and fine finger movements are intact. There is no dysmetria on finger-to-nose and heel-knee-shin.    GAIT/STANCE: Posture is normal. Gait is steady with normal steps, base, arm swing, and turning. Heel and toe walking are  normal. Tandem gait is normal.  Romberg is absent.   DIAGNOSTIC DATA (LABS, IMAGING, TESTING) - I reviewed patient records, labs, notes, testing and imaging myself where available.   ASSESSMENT AND PLAN  Patricia Greer is a 23 y.o. female   Left arm numbness  It did not happen when she was receiving blood draw, there was no significant/well-defined sensory loss on examinations  Less suggestive of peripheral nerve involvement, she does has hyperreflexia  Need to rule out right hemisphere, left cervical cord structural lesion  I have ordered MRI of the brain, cervical spine (she preferred to have it after delivery)  Also EMG nerve conduction  study   Marcial Pacas, M.D. Ph.D.  [facility] [address] [place], [place] [address] Ph: [phone] Fax: [phone]  CC: Referring Provider

## 2019-04-16 NOTE — Telephone Encounter (Signed)
Medicaid order sent to GI. They will obtain the auth and reach out to the pt to schedule.  °

## 2019-05-03 LAB — OB RESULTS CONSOLE GBS: GBS: NEGATIVE

## 2019-05-28 ENCOUNTER — Other Ambulatory Visit: Payer: Self-pay

## 2019-05-28 ENCOUNTER — Inpatient Hospital Stay (HOSPITAL_COMMUNITY): Payer: Medicaid Other | Admitting: Anesthesiology

## 2019-05-28 ENCOUNTER — Telehealth (HOSPITAL_COMMUNITY): Payer: Self-pay | Admitting: *Deleted

## 2019-05-28 ENCOUNTER — Inpatient Hospital Stay (HOSPITAL_COMMUNITY)
Admission: AD | Admit: 2019-05-28 | Discharge: 2019-05-30 | DRG: 807 | Disposition: A | Discharge status: Home / Self Care | Payer: Medicaid Other | Attending: Obstetrics and Gynecology | Admitting: Obstetrics and Gynecology

## 2019-05-28 ENCOUNTER — Encounter (HOSPITAL_COMMUNITY): Payer: Self-pay | Admitting: *Deleted

## 2019-05-28 DIAGNOSIS — Z20828 Contact with and (suspected) exposure to other viral communicable diseases: Secondary | ICD-10-CM | POA: Diagnosis present

## 2019-05-28 DIAGNOSIS — R87610 Atypical squamous cells of undetermined significance on cytologic smear of cervix (ASC-US): Secondary | ICD-10-CM | POA: Clinically undetermined

## 2019-05-28 DIAGNOSIS — D241 Benign neoplasm of right breast: Secondary | ICD-10-CM | POA: Diagnosis present

## 2019-05-28 DIAGNOSIS — O26893 Other specified pregnancy related conditions, third trimester: Secondary | ICD-10-CM | POA: Diagnosis present

## 2019-05-28 DIAGNOSIS — Z3A39 39 weeks gestation of pregnancy: Secondary | ICD-10-CM

## 2019-05-28 LAB — TYPE AND SCREEN
ABO/RH(D): B POS
Antibody Screen: NEGATIVE

## 2019-05-28 LAB — CBC
HCT: 36.3 % (ref 36.0–46.0)
Hemoglobin: 12.1 g/dL (ref 12.0–15.0)
MCH: 30.9 pg (ref 26.0–34.0)
MCHC: 33.3 g/dL (ref 30.0–36.0)
MCV: 92.6 fL (ref 80.0–100.0)
Platelets: 229 10*3/uL (ref 150–400)
RBC: 3.92 MIL/uL (ref 3.87–5.11)
RDW: 13.9 % (ref 11.5–15.5)
WBC: 13.8 10*3/uL — ABNORMAL HIGH (ref 4.0–10.5)
nRBC: 0 % (ref 0.0–0.2)

## 2019-05-28 LAB — ABO/RH: ABO/RH(D): B POS

## 2019-05-28 MED ORDER — FENTANYL CITRATE (PF) 100 MCG/2ML IJ SOLN: 50.0000 ug | Freq: Once | INTRAMUSCULAR | Status: AC

## 2019-05-28 MED ORDER — PHENYLEPHRINE 40 MCG/ML (10ML) SYRINGE FOR IV PUSH (FOR BLOOD PRESSURE SUPPORT): 80.0000 ug | PREFILLED_SYRINGE | INTRAVENOUS | Status: DC | PRN

## 2019-05-28 MED ORDER — EPHEDRINE 5 MG/ML INJ: 10.0000 mg | INTRAVENOUS | Status: DC | PRN

## 2019-05-28 MED ORDER — SOD CITRATE-CITRIC ACID 500-334 MG/5ML PO SOLN: 30.0000 mL | ORAL | Status: DC | PRN

## 2019-05-28 MED ORDER — LACTATED RINGERS IV SOLN: 500.0000 mL | INTRAVENOUS | Status: DC | PRN

## 2019-05-28 MED ORDER — LIDOCAINE-EPINEPHRINE (PF) 2 %-1:200000 IJ SOLN
INTRAMUSCULAR | Status: DC | PRN
  Administered 2019-05-28 (×2): 2 mL via EPIDURAL

## 2019-05-28 MED ORDER — OXYTOCIN 40 UNITS IN NORMAL SALINE INFUSION - SIMPLE MED
2.5000 [IU]/h | INTRAVENOUS | Status: DC
  Administered 2019-05-28: 23:00:00 2.5 [IU]/h via INTRAVENOUS
  Filled 2019-05-28: qty 1000

## 2019-05-28 MED ORDER — ONDANSETRON HCL 4 MG/2ML IJ SOLN: 4.0000 mg | Freq: Four times a day (QID) | INTRAMUSCULAR | Status: DC | PRN

## 2019-05-28 MED ORDER — LACTATED RINGERS IV SOLN: 500.0000 mL | Freq: Once | INTRAVENOUS | Status: DC

## 2019-05-28 MED ORDER — LACTATED RINGERS IV SOLN
INTRAVENOUS | Status: DC
  Administered 2019-05-28: 21:00:00 via INTRAVENOUS

## 2019-05-28 MED ORDER — OXYCODONE-ACETAMINOPHEN 5-325 MG PO TABS: 2.0000 | ORAL_TABLET | ORAL | Status: DC | PRN

## 2019-05-28 MED ORDER — OXYCODONE-ACETAMINOPHEN 5-325 MG PO TABS: 1.0000 | ORAL_TABLET | ORAL | Status: DC | PRN

## 2019-05-28 MED ORDER — FENTANYL-BUPIVACAINE-NACL 0.5-0.125-0.9 MG/250ML-% EP SOLN
12.0000 mL/h | EPIDURAL | Status: DC | PRN
  Filled 2019-05-28: qty 250

## 2019-05-28 MED ORDER — OXYTOCIN BOLUS FROM INFUSION
500.0000 mL | Freq: Once | INTRAVENOUS | Status: AC
  Administered 2019-05-28: 23:00:00 500 mL via INTRAVENOUS

## 2019-05-28 MED ORDER — FENTANYL CITRATE (PF) 100 MCG/2ML IJ SOLN
INTRAMUSCULAR | Status: AC
  Administered 2019-05-28: 21:00:00 50 ug via INTRAVENOUS
  Filled 2019-05-28: qty 2

## 2019-05-28 MED ORDER — LIDOCAINE HCL (PF) 1 % IJ SOLN
30.0000 mL | INTRAMUSCULAR | Status: AC | PRN
  Administered 2019-05-28: 30 mL via SUBCUTANEOUS
  Filled 2019-05-28: qty 30

## 2019-05-28 MED ORDER — ACETAMINOPHEN 325 MG PO TABS: 650.0000 mg | ORAL_TABLET | ORAL | Status: DC | PRN

## 2019-05-28 MED ORDER — SODIUM CHLORIDE (PF) 0.9 % IJ SOLN
INTRAMUSCULAR | Status: DC | PRN
  Administered 2019-05-28: 12 mL/h via EPIDURAL

## 2019-05-28 MED ORDER — DIPHENHYDRAMINE HCL 50 MG/ML IJ SOLN: 12.5000 mg | INTRAMUSCULAR | Status: DC | PRN

## 2019-05-28 NOTE — MAU Note (Signed)
Patient presents to MAU c/o of ctx since 0300.  +FM, denies vaginal bleeding or LOF. Patient reports cervix 4.5 cm today in office.

## 2019-05-28 NOTE — Anesthesia Procedure Notes (Signed)
Epidural Patient location during procedure: OB Start time: 05/28/2019 9:15 PM End time: 05/28/2019 9:30 PM  Staffing Anesthesiologist: Freddrick March, MD Performed: anesthesiologist   Preanesthetic Checklist Completed: patient identified, IV checked, risks and benefits discussed, monitors and equipment checked, pre-op evaluation and timeout performed  Epidural Patient position: sitting Prep: DuraPrep and site prepped and draped Patient monitoring: continuous pulse ox, blood pressure, heart rate and cardiac monitor Approach: midline Location: L3-L4 Injection technique: LOR air  Needle:  Needle type: Tuohy  Needle gauge: 17 G Needle length: 9 cm Needle insertion depth: 4.5 cm Catheter type: closed end flexible Catheter size: 19 Gauge Catheter at skin depth: 11 cm Test dose: negative  Assessment Sensory level: T8 Events: blood not aspirated, injection not painful, no injection resistance, no paresthesia and negative IV test  Additional Notes Patient identified. Risks/Benefits/Options discussed with patient including but not limited to bleeding, infection, nerve damage, paralysis, failed block, incomplete pain control, headache, blood pressure changes, nausea, vomiting, reactions to medication both or allergic, itching and postpartum back pain. Confirmed with bedside nurse the patient's most recent platelet count. Confirmed with patient that they are not currently taking any anticoagulation, have any bleeding history or any family history of bleeding disorders. Patient expressed understanding and wished to proceed. All questions were answered. Sterile technique was used throughout the entire procedure. Please see nursing notes for vital signs. Test dose was given through epidural catheter and negative prior to continuing to dose epidural or start infusion. Warning signs of high block given to the patient including shortness of breath, tingling/numbness in hands, complete motor  block, or any concerning symptoms with instructions to call for help. Patient was given instructions on fall risk and not to get out of bed. All questions and concerns addressed with instructions to call with any issues or inadequate analgesia.  Reason for block:procedure for pain

## 2019-05-28 NOTE — Telephone Encounter (Signed)
Preadmission screen  

## 2019-05-28 NOTE — H&P (Signed)
OB ADMISSION/ HISTORY & PHYSICAL:  Admission Date: 05/28/2019  7:15 PM  Admit Diagnosis: Term IUP, active labor  Patricia Greer is a 23 y.o. female G1P0 [redacted]w[redacted]d presenting for ctx. Endorses active FM, denies LOF and vaginal bleeding. Ctx began @ 0300 on 05/28/19. Pt was seen for routine OB visit w/ NST today and in early labor. Pt states ctx increased in intensity @ 1700. Pt to be admitted to L&D.   History of current pregnancy: G1P0   Patient was a late entry to prenatal care with [facility] at 18 weeks.   EDC of 05/28/19 was established by [place] @ 18 wks    Anatomy scan:  18 weeks, with normal findings and anterior placenta.   Last evaluation: 36w 3d AFI 21.7cm EFW= 2873g, 6# 5oz, 46%ile  Significant prenatal events: Late entry to care  Patient Active Problem List   Diagnosis Date Noted  . Normal labor 05/28/2019  . 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 Atypical squamous cells of undetermined significance (ASCUS) on Pap 2019 05/28/2019  . Left arm numbness 04/16/2019    Prenatal Labs: ABO, Rh: --/--/B POS, B POS Performed at [facility], [place] [address], [place], [place] [address]  [phone] [phone]) Antibody: NEG (12/14 2037) Rubella: Immune (07/15 0000)  RPR: Nonreactive (07/15 0000)  HBsAg: Negative (07/15 0000)  HIV: Non-reactive (07/15 0000)  GTT: passed 1 hr GBS: Negative/-- (11/19 0000)  GC/CHL: neg/neg Genetics:low    OB History  Gravida Para Term Preterm AB Living  1            SAB TAB Ectopic Multiple Live Births               # Outcome Date GA Lbr Len/2nd Weight Sex Delivery Anes PTL Lv  1 Current             Medical / Surgical History: Past medical history:  Past Medical History:  Diagnosis Date  . Chlamydia   . Infection    UTI  . Medical history non-contributory   . Numbness and tingling   . Trichomonas vaginitis     Past surgical history:  Past Surgical History:  Procedure Laterality Date  . BREAST SURGERY Right    removed fibroid  cyst   Family History:  Family History  Problem Relation Age of Onset  . Anxiety disorder Mother   . Lupus Mother   . Healthy Father     Social History:  reports that she has never smoked. She has never used smokeless tobacco. She reports previous alcohol use. She reports that she does not use drugs.  Allergies: Amoxicillin   Current Medications at time of admission:  Prior to Admission medications   Medication Sig Start Date End Date Taking? Authorizing Provider  Prenatal Vit-Fe Fumarate-FA (PRENATAL COMPLETE) 14-0.4 MG TABS Take 1 tablet by mouth daily. 09/26/18   Drenda Freeze, MD    Review of Systems: Constitutional: Negative   HENT: Negative   Eyes: Negative   Respiratory: Negative   Cardiovascular: Negative   Gastrointestinal: Negative  Genitourinary: negative for bloody show, negative for LOF   Musculoskeletal: Negative   Skin: Negative   Neurological: Negative   Endo/Heme/Allergies: Negative   Psychiatric/Behavioral: Negative    Physical Exam: VS: Blood pressure 122/86, pulse 78, temperature (!) 97.5 F (36.4 C), temperature source Oral, resp. rate (!) 22, last menstrual period 08/25/2018, SpO2 98 %. AAO x3, no signs of distress Cardiovascular: RRR Respiratory: Lung fields clear to ausculation  GU/GI: Abdomen gravid, non-tender, non-distended, active FM, vertex and 6#8 oz per Leopold's Extremities: No edema, negative for pain, tenderness, and cords  Cervical exam:Dilation: 8 Effacement (%): 100 Station: 0 Exam by:: Burman Foster, CNM FHR: baseline rate 135 / variability moderate / accelerations present / absent decelerations TOCO: 2-3 min   Prenatal Transfer Tool  Maternal Diabetes: No Genetic Screening: Normal Maternal Ultrasounds/Referrals: Normal Fetal Ultrasounds or other Referrals:  None Maternal Substance Abuse:  No Significant Maternal Medications:  None Significant Maternal Lab Results: Group B Strep negative    Assessment: 23 y.o.  G1P0 [redacted]w[redacted]d  Active stage of labor FHR category 1 GBS negative Pain management: Requesting epidural   Plan:  Admit to L&D Routine admission orders Epidural PRN  Dr Landry Mellow notified of admission and plan of care  Arrie Eastern CNM, MSN 05/28/2019 9:56 PM

## 2019-05-28 NOTE — Anesthesia Preprocedure Evaluation (Signed)

## 2019-05-29 ENCOUNTER — Encounter (HOSPITAL_COMMUNITY): Payer: Self-pay | Admitting: Obstetrics and Gynecology

## 2019-05-29 LAB — CBC
HCT: 32.7 % — ABNORMAL LOW (ref 36.0–46.0)
Hemoglobin: 11.3 g/dL — ABNORMAL LOW (ref 12.0–15.0)
MCH: 31.2 pg (ref 26.0–34.0)
MCHC: 34.6 g/dL (ref 30.0–36.0)
MCV: 90.3 fL (ref 80.0–100.0)
Platelets: 217 10*3/uL (ref 150–400)
RBC: 3.62 MIL/uL — ABNORMAL LOW (ref 3.87–5.11)
RDW: 13.8 % (ref 11.5–15.5)
WBC: 14.4 10*3/uL — ABNORMAL HIGH (ref 4.0–10.5)
nRBC: 0 % (ref 0.0–0.2)

## 2019-05-29 LAB — SARS CORONAVIRUS 2 (TAT 6-24 HRS): SARS Coronavirus 2: NEGATIVE

## 2019-05-29 LAB — RPR: RPR Ser Ql: NONREACTIVE

## 2019-05-29 MED ORDER — PRENATAL MULTIVITAMIN CH
1.0000 | ORAL_TABLET | Freq: Every day | ORAL | Status: DC
  Administered 2019-05-29 – 2019-05-30 (×2): 1 via ORAL
  Filled 2019-05-29 (×2): qty 1

## 2019-05-29 MED ORDER — ACETAMINOPHEN 325 MG PO TABS
650.0000 mg | ORAL_TABLET | ORAL | Status: DC | PRN
  Administered 2019-05-29: 650 mg via ORAL
  Filled 2019-05-29: qty 2

## 2019-05-29 MED ORDER — COCONUT OIL OIL
1.0000 "application " | TOPICAL_OIL | Status: DC | PRN
  Administered 2019-05-30: 1 via TOPICAL

## 2019-05-29 MED ORDER — DIPHENHYDRAMINE HCL 25 MG PO CAPS: 25.0000 mg | ORAL_CAPSULE | Freq: Four times a day (QID) | ORAL | Status: DC | PRN

## 2019-05-29 MED ORDER — SIMETHICONE 80 MG PO CHEW: 80.0000 mg | CHEWABLE_TABLET | ORAL | Status: DC | PRN

## 2019-05-29 MED ORDER — BENZOCAINE-MENTHOL 20-0.5 % EX AERO
1.0000 "application " | INHALATION_SPRAY | CUTANEOUS | Status: DC | PRN
  Administered 2019-05-29: 1 via TOPICAL
  Filled 2019-05-29: qty 56

## 2019-05-29 MED ORDER — ONDANSETRON HCL 4 MG PO TABS: 4.0000 mg | ORAL_TABLET | ORAL | Status: DC | PRN

## 2019-05-29 MED ORDER — WITCH HAZEL-GLYCERIN EX PADS: 1.0000 "application " | MEDICATED_PAD | CUTANEOUS | Status: DC | PRN

## 2019-05-29 MED ORDER — TETANUS-DIPHTH-ACELL PERTUSSIS 5-2.5-18.5 LF-MCG/0.5 IM SUSP: 0.5000 mL | Freq: Once | INTRAMUSCULAR | Status: DC

## 2019-05-29 MED ORDER — DIBUCAINE (PERIANAL) 1 % EX OINT: 1.0000 "application " | TOPICAL_OINTMENT | CUTANEOUS | Status: DC | PRN

## 2019-05-29 MED ORDER — ONDANSETRON HCL 4 MG/2ML IJ SOLN: 4.0000 mg | INTRAMUSCULAR | Status: DC | PRN

## 2019-05-29 MED ORDER — IBUPROFEN 600 MG PO TABS
600.0000 mg | ORAL_TABLET | Freq: Four times a day (QID) | ORAL | Status: DC
  Administered 2019-05-29 – 2019-05-30 (×6): 600 mg via ORAL
  Filled 2019-05-29 (×6): qty 1

## 2019-05-29 MED ORDER — SENNOSIDES-DOCUSATE SODIUM 8.6-50 MG PO TABS
2.0000 | ORAL_TABLET | ORAL | Status: DC
  Administered 2019-05-29: 2 via ORAL
  Filled 2019-05-29: qty 2

## 2019-05-29 NOTE — Anesthesia Postprocedure Evaluation (Signed)
Anesthesia Post Note  Patient: [name]  Procedure(s) Performed: AN AD [place]     Patient location during evaluation: Mother Baby Anesthesia Type: Epidural Level of consciousness: awake and alert Pain management: pain level controlled Vital Signs Assessment: post-procedure vital signs reviewed and stable Respiratory status: spontaneous breathing, nonlabored ventilation and respiratory function stable Cardiovascular status: stable Postop Assessment: no headache, no backache, epidural receding, patient able to bend at knees, able to ambulate, no apparent nausea or vomiting and adequate PO intake Anesthetic complications: no    Last Vitals:  Vitals:   05/29/19 0304 05/29/19 0730  BP: 114/69 112/80  Pulse: 92 85  Resp: 18 18  Temp: 36.7 C 37 C  SpO2: 100% 100%    Last Pain:  Vitals:   05/29/19 0730  TempSrc: Oral  PainSc:    Pain Goal:                   Talitha Givens

## 2019-05-29 NOTE — Lactation Note (Signed)
This note was copied from a baby's chart. Lactation Consultation Note  Patient Name: Patricia Greer [id number] Date: 05/29/2019 Reason for consult: Follow-up assessment;Mother's request;Difficult latch;Primapara;1st time breastfeeding;Term  2005 - 2045 - I followed up with [name] upon request. She states that her son has not breast fed to date. He has had a few feedings via donor breast milk by spoon. Baby was cueing while being held by [name]' support person upon entry. He had a pacifier in his mouth. [name] states that she was trying to avoid using a pacifier but baby was fussy and she needed to soothe him.  We had a lengthy discussion about her concerns and feeding preferences. [name] articulated that her goal was to be able to provide breast milk to baby and avoid formula. She is concerned about baby's difficulty latching, and she wants to provide him with a bottle to soothe him. She feels that the small feedings via spoon are not sufficient, and she expresses concerns that he needs more.  I listened to her concerns, and we ordered more donor breast milk. We attempted to latch him to the breast in cross cradle hold on the right side. We practiced hand expression first and she noted sensitivity. She states that she has always been sensitive and she will breast feed because of benefits to baby, but she is not completely comfortable with the idea.  Baby did not latch to the breast. I tried with a nipple shield and some breast milk inserted, and he briefly latched and suckled, and then he stopped and held the nipple in his mouth. He would not latch after a few additional attempts.  I allowed the baby to suckle on a gloved finger, and I noted he did have a suck reflex, but he was a bit disorganized and weak. We dicussed developmental readiness to breast feed and I educated on signs of readiness for breast feeding. I encouraged [name] to try again when he showed readiness cues.  In  the meantime, my recommendation was to feed baby and to protect her milk. I recommend that she pump every three hours for 15 minutes (both breasts).   [name] expressed interest in providing a bottle for future feedings. She wants to pump and offer the breast, but at this time, she expressed that she was feeling overwhelmed and would prefer to take a break and bottle feed at this time. I discussed with nighttime LC and evening RN; Ms. Funke is open to formula feeding if she wants to provide a bottle.   I fed baby 10 mls of donor milk via foley cup. I recommend feeding remainder of breast milk via cup or syringe (finger feeding) and then transitioning to formula by bottle, upon [name]' request. I educated her on the risks of artificial nipples and pacifiers, and she verbalized understanding.   Maternal Data Formula Feeding for Exclusion: No Has patient been taught Hand Expression?: Yes Does the patient have breastfeeding experience prior to this delivery?: No  Feeding Feeding Type: Breast Fed  LATCH Score Latch: Too sleepy or reluctant, no latch achieved, no sucking elicited.  Audible Swallowing: None  Type of Nipple: Everted at rest and after stimulation  Comfort (Breast/Nipple): Soft / non-tender  Hold (Positioning): Assistance needed to correctly position infant at breast and maintain latch.  LATCH Score: 5  Interventions Interventions: Breast feeding basics reviewed;Assisted with latch;Skin to skin;Hand express  Lactation Tools Discussed/Used Tools: Nipple [place] Fuel;Feeding cup Nipple shield size: 20  Breast pump type: Double-Electric Breast Pump Pump Review: Setup, frequency, and cleaning;Milk Storage   Consult Status Consult Status: Follow-up Date: 05/30/19 Follow-up type: In-patient    [name] 05/29/2019, 9:01 PM

## 2019-05-29 NOTE — Progress Notes (Signed)
PPD# 1 VAVD w/ midline epsi Information for the patient's newborn:  [place], [place] [id number]  female    Baby Name Dixon Circumcision out-patient w/ [facility]   S:   Reports feeling good Tolerating PO fluid and solids No nausea or vomiting Bleeding is light, no clots Pain controlled with ibuprofen (OTC) Up ad lib / ambulatory / voiding w/o difficulty Feeding: Breast    O:   VS: BP 114/69 (BP Location: Left Arm)   Pulse 92   Temp 98 F (36.7 C) (Oral)   Resp 18   Ht 5\' 4"  (1.626 m)   Wt 78.9 kg   LMP 08/25/2018   SpO2 100%   Breastfeeding Unknown   BMI 29.87 kg/m   LABS:  Recent Labs    05/28/19 2037 05/29/19 0520  WBC 13.8* 14.4*  HGB 12.1 11.3*  PLT 229 217   Blood type: --/--/B POS, B POS Performed at [facility], [place] [address] [place], [place] [address]  [phone] 2037) Rubella: Immune (07/15 0000)                      I&O: Intake/Output      12/14 0701 - 12/15 0700 12/15 0701 - 12/16 0700   Urine (mL/kg/hr) 500    Blood 82    Total Output 582    Net -582           Physical Exam: Alert and oriented X3 Lungs: Clear and unlabored Heart: regular rate and rhythm / no mumurs Abdomen: soft, non-tender, non-distended  Fundus: firm, non-tender Perineum: well-approximated Lochia: appropriate Extremities: no edema, no calf pain or tenderness    A:  PPD # 1 S/P VAVD Normal exam  P:  Routine post partum orders Anticipate D/C on 05/30/19   Plan reviewed w/ Dr. Jethro Bastos, MSN, CNM 05/29/2019, 7:21 AM

## 2019-05-29 NOTE — Lactation Note (Signed)
This note was copied from a baby's chart. Lactation Consultation Note  Patient Name: Patricia Greer [id number] Date: 05/29/2019 Reason for consult: Initial assessment;1st time breastfeeding;Term P1, 7 hour female infant who been in Central Nursery due low temperatures. Tools given: DEBP, hand pump due to mom having inverted nipple on right breast only.  Per mom, she made two attempts to latch infant previously. Mom decided to receive donor milk and LC alert Nurse this is mom's choice. LC discussed hand expression and notice colostrum is not present at this time. Mom had inverted nipple on right breast and LC notice breast tissue everted out with stimulation when using hand pump. Mom knows to pre-pump breast prior to latching infant on right breast. Mom plans to use DEBP every 3 hours for 15 minutes on initial setting. Mom shown how to use DEBP & how to disassemble, clean, & reassemble parts.  Mom will work on latching infant at breast, infant came back from Central Nursery as [facility] was leaving the room. Mom knows to breastfeed infant according to hunger cues, 8 to 12 times within 24 hours on demand. Mom knows to call RN or LC if she has questions, concerns or need assistance with latching infant at breast. Mom made aware of O/P services, breastfeeding support groups, community resources, and our phone # for post-discharge questions.   Maternal Data Formula Feeding for Exclusion: No Has patient been taught Hand Expression?: Yes Does the patient have breastfeeding experience prior to this delivery?: No  Feeding Feeding Type: Breast Fed  LATCH Score Latch: Too sleepy or reluctant, no latch achieved, no sucking elicited.  Audible Swallowing: None  Type of Nipple: Everted at rest and after stimulation  Comfort (Breast/Nipple): Soft / non-tender  Hold (Positioning): Full assist, staff holds infant at breast  LATCH Score: 4  Interventions Interventions: Breast feeding basics  reviewed;Hand express;Pre-pump if needed;DEBP;Hand pump  Lactation Tools Discussed/Used Tools: Pump Breast pump type: Double-Electric Breast Pump WIC Program: Yes Pump Review: Setup, frequency, and cleaning;Milk Storage Initiated by:: Vicente Serene, IBCLC Date initiated:: 05/29/19   Consult Status Consult Status: Follow-up Date: 05/29/19 Follow-up type: In-patient    Vicente Serene 05/29/2019, 6:56 AM

## 2019-05-30 MED ORDER — IBUPROFEN 600 MG PO TABS: 600.0000 mg | ORAL_TABLET | Freq: Four times a day (QID) | ORAL | 0 refills | Status: DC

## 2019-05-30 NOTE — Discharge Summary (Addendum)
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 yo G1P1001 at [redacted]w[redacted]d was admitted in Active Labor on 05/28/2019. Patient had an uncomplicated labor course progressed to complete, FHR Cat 2 for bradycardia. Faculty MD called for operative vaginal birth:  Membrane Rupture Time/Date: 10:52 PM ,05/28/2019   Intrapartum Procedures: Episiotomy: Median [2]                                         Lacerations:  None [1]  Patient had a delivery of a Viable infant. 05/28/2019  Information for the patient's newborn:  Landra, Solak [id number]  Delivery Method: Vaginal, Vacuum (Extractor)(Filed from Delivery Summary)     Pateint had an uncomplicated postpartum course.  She is ambulating, tolerating a regular diet, passing flatus, and urinating well. Patient is discharged home in stable condition on 05/30/19.   Physical exam  Vitals:   05/29/19 1130 05/29/19 1545 05/29/19 2122 05/30/19 0510  BP: 119/71 109/62 121/68 112/74  Pulse: 78 79 97 77  Resp: 18 18 16 18   Temp: 98 F (36.7 C) 98.6 F (37 C) 98.1 F (36.7 C) 97.7 F (36.5 C)  TempSrc: Oral Oral Oral Oral  SpO2: 99% 100% 100% 100%  Weight:      Height:       General: alert, cooperative and no  distress Lochia: appropriate Uterine Fundus: firm Perineum: well-approximated, non-edematous DVT Evaluation: No evidence of DVT seen on physical exam. No cords or calf tenderness. No significant calf/ankle edema. Labs: Lab Results  Component Value Date   WBC 14.4 (H) 05/29/2019   HGB 11.3 (L) 05/29/2019   HCT 32.7 (L) 05/29/2019   MCV 90.3 05/29/2019   PLT 217 05/29/2019   CMP Latest Ref Rng & Units 09/26/2018  Glucose 70 - 99 mg/dL -  BUN 6 - 20 mg/dL -  Creatinine 0.44 - 1.00 mg/dL -  Sodium 135 - 145 mmol/L 137  Potassium 3.5 - 5.1 mmol/L 3.6  Chloride 98 - 111 mmol/L -  CO2 22 - 32 mmol/L -  Calcium 8.9 - 10.3 mg/dL -  Total Protein 6.5 - 8.1 g/dL -  Total Bilirubin 0.3 - 1.2 mg/dL -  Alkaline Phos 38 - 126 U/L -  AST 15 - 41 U/L -  ALT 0 - 44 U/L -    Discharge instruction: per After Visit Summary and "Baby and Me Booklet".  After visit meds:  Allergies as of 05/30/2019      Reactions   Amoxicillin Other (See Comments)   Severe yeast infection      Medication List    TAKE these medications   ibuprofen 600 MG tablet Commonly known as: ADVIL Take 1 tablet (600 mg total) by mouth every 6 (six) hours.   Prenatal Complete 14-0.4 MG Tabs Take 1 tablet by mouth daily.       Diet: routine diet  Activity: Advance as tolerated. Pelvic rest for 6 weeks.   Outpatient follow up:6 weeks Follow up Appt: Future Appointments  Date Time Provider [place]  06/02/2019  9:30 AM [facility] None   Follow up Visit:No follow-ups on file.  Postpartum contraception: Undecided  Newborn Data: Live born female  Birth Weight: 7 lb 1.9 oz (3230 g) APGAR: 8, 9  Newborn Delivery   Birth date/time: [birth year]/2019 23:07:00 Delivery type: Vaginal, Vacuum (Extractor)      Baby Feeding: Bottle and Breast Disposition:home with mother   05/30/2019 Arrie Eastern, CNM

## 2019-05-30 NOTE — Lactation Note (Signed)
This note was copied from a baby's chart. Lactation Consultation Note  Patient Name: Patricia Greer [id number] Date: 05/30/2019 Reason for consult: Follow-up assessment  Mom has been frustrated with breastfeeding & decided to pump & BO. Mom has not pumped since yesterday evening & only pumped a few times yesterday. I explained to Mom that if she wants to increase her potential supply, she needs to express her milk every time infant receives formula.   Mom's nipple diameter is very small. I brought in size 21 flanges & was observing her pumping. CNM came into room to speak with patient. This LC to return.   Matthias Hughs [facility] 05/30/2019, 9:10 AM

## 2019-05-30 NOTE — Lactation Note (Signed)
This note was copied from a baby's chart. Lactation Consultation Note  Patient Name: Patricia Greer [id number] Date: 05/30/2019 Reason for consult: Follow-up assessment  Mom is more comfortable with size 21 flanges. However, she could likely benefit from smaller flanges (which are not made by [employer]). I showed her the smaller flanges made by [facility] & encouraged her to consider them. Mom was shown how to assemble & use hand pump (single- & double mode) that was included in pump kit. I also discussed how to wash the pump parts & assisted her in putting the pieces together.   Adonis Huguenin, CNM talked to Mom about the importance of letting infant's saliva have contact with the nipple & I showed Mom the specifics of an asymmetric latch via [employer]. Mom is interested in showing me how to latch the infant before she goes home. Infant is sleeping peacefully in bassinet. Mom will call me when she is ready for me to return, as she needs to do some self-care now.   Mom has decided to use [facility] since they have a Lactation Consultant.    Matthias Hughs [facility] 05/30/2019, 9:47 AM

## 2019-05-30 NOTE — Lactation Note (Signed)
This note was copied from a baby's chart. Lactation Consultation Note  Patient Name: Patricia Greer [id number] Date: 05/30/2019   Mom called out for me to see infant latch. I encouraged skin-to-skin, but Mom had recently dressed the infant. Mom was willing to try side-lying. Latching was attempted in side-lying & with Mom leaning over baby in bed, but without success: infant would root, but didn't latch. The lactation student & I normalized what we were observing infant do & we encouraged Mom to continue trying--Mom said she would continue to try at home.     Matthias Hughs [facility] 05/30/2019, 1:19 PM

## 2019-06-02 ENCOUNTER — Other Ambulatory Visit (HOSPITAL_COMMUNITY): Admission: RE | Admit: 2019-06-02 | Payer: Medicaid Other | Source: Ambulatory Visit

## 2019-06-04 ENCOUNTER — Inpatient Hospital Stay (HOSPITAL_COMMUNITY)
Admission: AD | Admit: 2019-06-04 | Payer: Medicaid Other | Source: Home / Self Care | Admitting: Obstetrics & Gynecology

## 2019-06-04 ENCOUNTER — Inpatient Hospital Stay (HOSPITAL_COMMUNITY): Payer: Medicaid Other

## 2019-06-04 ENCOUNTER — Ambulatory Visit: Payer: Self-pay

## 2019-06-04 NOTE — Lactation Note (Signed)
This note was copied from a baby's chart. Lactation Consultation Note  Patient Name: Patricia Greer [id number] Date: 06/04/2019     06/04/2019  Name: Patricia Greer MRN: [id number] Date of Birth: 05/28/2021 Gestational Age: Gestational Age: [redacted]w[redacted]d Birth Weight: 113.9 oz Weight today:    7 pounds 5.2 ounces (3322 grams) with clean newborn diaper   51 day old infant presents with mom and dad for feeding assessment.   Infant has gained 217 grams in the last 5 days with an average daily weight gain of 43 grams a day. Infant is well above his birth weight.   Mom reports she is not sure if she wants to latch infant to the breast. She is contemplating pumping and bottle feeding.   Mom is using a [employer] hand pump currently. She is contacting [facility] to see about getting a DEBP. Mom is not pumping routinely. Reviewed supply and demand and importance of emptying the breast with each feeding.   Attempted to latch infant to the right breast. Infant would attempt to latch briefly and pull off. Infant became frustrated easily. Showed mom positioning and supporting breast with feeding. Showed mom how to sandwich nipple to latch infant. After several tries and different positions, infant latched to the right breast in the football hold with the # 24 NS. Infant did latch easily and sustained latch. Infant was frustrated until the milk started flowing and infant settled in to latch better. Mom did not want to relatch infant, infant feeding finished with a bottle.   Infant with thick short labial frenulum that inserts halfway down the gum ridge. Upper lip with some tightness with flanging, flanged well on the breast. Infant with snapback and tongue behind gumline when suckling on gloved finger. Showed parents suck training and enc before each feeding. Infant with short tight lingual frenulum. Infant with good tongue lateralization and better extension at rest. Infant was not able to sustain latch on the nipple  but nipple is very small and short shaft. Mom was pleased infant able to latch with the NS. Mom did note some pinching when infant pulling off the breast. Infant did well on the breast with the NS applied. He did choke a few times on the breast. Infant with smacking on the bottle and drooling on the bottle. Reviewed changing to the preemie nipple. Reviewed tongue and lip restrictions with mom and dad and how they can effect milk supply and milk transfer. Mom wants to learn more about it and decide if she wants infant evaluated. Local providers and website information given.   Infant to follow up Dr. Georgina Quint in 2 weeks. Infant to follow up with Lactation in 2 weeks.   Dad to return to work tomorrow. Mom is concerned with feeding plan, enc her to do the best she can.   Dad present and is very helpful to mom.      General Information: Mother's reason for visit: Feeding assessment, difficulty latching Consult: Initial Lactation consultant: Nonah Mattes RN,IBCLC Breastfeeding experience: Not currently latching Maternal medical conditions: (Fibroid cyst removal a few years ago to 103 oclock area to top of breast) Maternal medications: Pre-natal vitamin, Motrin (ibuprofen), Tylenol (acetaminophen)  Breastfeeding History: Frequency of breast feeding: not latching currently    Supplementation: Supplement method: bottle(Tommie Tippee newborn nipple) Brand: Enfamil Formula volume: 2-3 ounces Formula frequency: every 2.5-3 hours   Breast milk volume: 2.5-3 ounces Breast milk frequency: 1 x a day   Pump type: Manual Pump frequency: a few  times a day Pump volume: 1-2 oiunces  Infant Output Assessment: Voids per 24 hours: 10 Urine color: Clear yellow Stools per 24 hours: 3-4 Stool color: Yellow  Breast Assessment: Breast: Filling, Compressible Nipple: Erect(small in size, short shaft) Pain level: 2 Pain interventions: Bra, Breast pump, Nipple shield, Coconut oil, Expressed breast  milk  Feeding Assessment: Infant oral assessment: Variance Infant oral assessment comment: see note Positioning: Football(right breast, 10 minutes) Latch: 2 - Grasps breast easily, tongue down, lips flanged, rhythmical sucking. Audible swallowing: 2 - Spontaneous and intermittent Type of nipple: 2 - Everted at rest and after stimulation Comfort: 1 - Filling, red/small blisters or bruises, mild/mod discomfort Hold: 1 - Assistance needed to correctly position infant at breast and maintain latch LATCH score: 8 Latch assessment: Deep Lips flanged: Yes Suck assessment: Displays both Tools: Nipple shield 24 mm Pre-feed weight: 3322 grams Post feed weight: 3342 grams Amount transferred: 20 ml Amount supplemented: 0  Additional Feeding Assessment: Infant oral assessment: Variance Infant oral assessment comment: see note Positioning: Cross cradle Latch: 2 - Grasps breast easily, tongue down, lips flanged, rhythmical sucking. Audible swallowing: 2 - Spontaneous and intermittent Type of nipple: 2 - Everted at rest and after stimulation Comfort: 1 - Filling, red/small blisters or bruises, mild/mod discomfort Hold: 1 - Assistance needed to correctly position infant at breast and maintain latch LATCH score: 8 Latch assessment: Deep Lips flanged: Yes Suck assessment: Displays both Tools: Nipple shield 24 mm Pre-feed weight: 3342 grams Post feed weight: 3364 grams Amount transferred: 22 ml Amount supplemented: 30 ml  Totals: Total amount transferred: 44 ml Total supplement given: 30 ml Total amount pumped post feed: did not pump   Plan:  1. Offer infant the breast with feeding cues as mom and infant want try for at least 3-4 x a day 2. Use the # 24 Nipple Shield with feeding as needed to sustain latch. Try each day without the Nipple Shield to see when infant can feed without it. Goal is to wean off the Nipple Shield as early as possible.  3. Pre pump as needed to soften areola if  really full prior to latch 4. Keep infant awake at the breast as needed 5. Massage the breast with feeding as needed to keep infant active at the breast 6. Offer infant a bottle of about an ounce pumped milk or formula if he is frantic at the breast and will not latch 7. Offer infant a bottle of breast milk or formula as needed after breast feeding if infant is still cueing to feed 8. Feed infant using the paced bottle feeding method (video on [email or web address].[email or web address]) 9. Use the Tommie Tippee Bottle that you have been, if infant choking or drooling on the bottle, switch to the Preemie or level 0 nipple 10. Infant needs about 62-83 ml (2-3 ounces) for 8 feedings a day or 495-660 ml (17-22 ml) in 24 hours. Infant may take more or less at one feeding depending on how often he feeds. Feed infant until he is satisfied. It is recommended that you pump each time your infant is getting a bottle of supplement.  11. Would recommend that you pump to empty the breasts at least 8 x a day to protect and promote your milk supply 12. Would recommend call WIC to inquire about an Double Electric Breast Pump 13. Keep up the good work 54. Thank you for allowing me to assist you today 15. Please call with any questions or concerns as needed 16.  Follow up with Lactation in 2 weeks    [place], IBCLC                                                   7 day old infant presents with mom and dad for feeding assessment.   Infant has gained 217 grams in the last 5 days with an average daily weight gain of 43 grams a day. Infant is well above his birth weight.   Mom reports she is not sure if she wants to latch infant to the breast. She is contemplating pumping and bottle feeding.   Mom is using a [employer] hand pump currently. She is contacting [facility] to see about getting a DEBP. Mom is not pumping routinely. Reviewed supply and demand and importance of emptying the breast with  each feeding.   Attempted to latch infant to the right breast. Infant would attempt to latch briefly and pull off. Infant became frustrated easily. Showed mom positioning and supporting breast with feeding. Showed mom how to sandwich nipple to latch infant. After several tries and different positions, infant latched to the right breast in the football hold with the # 24 NS. Infant did latch easily and sustained latch. Infant was frustrated until the milk started flowing and infant settled in to latch better. Mom did not want to relatch infant, infant feeding finished with a bottle.   Infant with thick short labial frenulum that inserts halfway down the gum ridge. Upper lip with some tightness with flanging, flanged well on the breast. Infant with snapback and tongue behind gumline when suckling on gloved finger. Infant with short tight lingual frenulum. Infant with good tongue lateralization and better extension at rest. Infant was not able to sustain latch on the nipple but nipple is very small and short shaft. Mom was pleased infant able to latch with the NS. Mom did note some pinching when infant pulling off the breast. Infant did well on the breast with the NS applied. He did choke a few times on the breast. Infant with smacking on the bottle and drooling on the bottle. Reviewed changing to the preemie nipple. Reviewed tongue and lip restrictions with mom and dad and how they can effect milk supply and milk transfer. Mom wants to learn more about it and decide if she wants infant evaluated. Local providers and website information given.   Infant to follow up Dr. Georgina Quint in 2 weeks. Infant to follow up with Lactation in 2 weeks.   Dad to return to work tomorrow. Mom is concerned with feeding plan, enc her to do the best she can.     Debby Freiberg Hice 06/04/2019, 1:29 PM

## 2019-11-05 ENCOUNTER — Emergency Department (HOSPITAL_COMMUNITY)
Admission: EM | Admit: 2019-11-05 | Discharge: 2019-11-05 | Disposition: A | Discharge status: Home / Self Care | Payer: Medicaid Other | Attending: Emergency Medicine | Admitting: Emergency Medicine

## 2019-11-05 ENCOUNTER — Encounter (HOSPITAL_COMMUNITY): Payer: Self-pay | Admitting: *Deleted

## 2019-11-05 ENCOUNTER — Emergency Department (HOSPITAL_COMMUNITY): Payer: Medicaid Other

## 2019-11-05 ENCOUNTER — Other Ambulatory Visit: Payer: Self-pay

## 2019-11-05 DIAGNOSIS — Z79899 Other long term (current) drug therapy: Secondary | ICD-10-CM | POA: Diagnosis not present

## 2019-11-05 DIAGNOSIS — R0789 Other chest pain: Secondary | ICD-10-CM

## 2019-11-05 LAB — I-STAT BETA HCG BLOOD, ED (MC, WL, AP ONLY): I-stat hCG, quantitative: 5.5 m[IU]/mL — ABNORMAL HIGH (ref <5)

## 2019-11-05 LAB — CBC
HCT: 36.2 % (ref 36.0–46.0)
Hemoglobin: 11.9 g/dL — ABNORMAL LOW (ref 12.0–15.0)
MCH: 30.1 pg (ref 26.0–34.0)
MCHC: 32.9 g/dL (ref 30.0–36.0)
MCV: 91.6 fL (ref 80.0–100.0)
Platelets: 236 10*3/uL (ref 150–400)
RBC: 3.95 MIL/uL (ref 3.87–5.11)
RDW: 14.6 % (ref 11.5–15.5)
WBC: 11.7 10*3/uL — ABNORMAL HIGH (ref 4.0–10.5)
nRBC: 0 % (ref 0.0–0.2)

## 2019-11-05 LAB — BASIC METABOLIC PANEL
Anion gap: 9 (ref 5–15)
BUN: 12 mg/dL (ref 6–20)
CO2: 26 mmol/L (ref 22–32)
Calcium: 9.1 mg/dL (ref 8.9–10.3)
Chloride: 102 mmol/L (ref 98–111)
Creatinine, Ser: 0.69 mg/dL (ref 0.44–1.00)
GFR calc Af Amer: >60 mL/min (ref >60)
GFR calc non Af Amer: >60 mL/min (ref >60)
Glucose, Bld: 99 mg/dL (ref 70–99)
Potassium: 4 mmol/L (ref 3.5–5.1)
Sodium: 137 mmol/L (ref 135–145)

## 2019-11-05 LAB — TROPONIN I (HIGH SENSITIVITY): Troponin I (High Sensitivity): <2 ng/L (ref <18)

## 2019-11-05 MED ORDER — SODIUM CHLORIDE 0.9% FLUSH: 3.0000 mL | Freq: Once | INTRAVENOUS | Status: DC

## 2019-11-05 NOTE — ED Provider Notes (Signed)
[facility] DEPT Provider Note   CSN: [id number] Arrival date & time: 11/05/19  1031     History Chief Complaint  Patient presents with  . Chest Pain    Patricia Greer is a 24 y.o. female.  Patient is a 24 year old female with no significant past medical history.  She presents today for evaluation of an episode of chest pain that began this morning.  She states she was walking across her kitchen when she developed the sudden onset of sharp pain to the left upper chest.  This pain lasted for approximately 1 minute, then seemed to improve.  She denies any shortness of breath, nausea, or diaphoresis along with this.  She denies any injury or trauma.  The history is provided by the patient.  Chest Pain Pain location:  L chest and L lateral chest Pain quality: sharp   Pain radiates to:  Does not radiate Onset quality:  Sudden Duration:  1 minute Timing:  Constant Progression:  Resolved Chronicity:  New Relieved by:  Nothing Worsened by:  Movement and deep breathing      Past Medical History:  Diagnosis Date  . Chlamydia   . Infection    UTI  . Medical history non-contributory   . Numbness and tingling   . Trichomonas vaginitis     Patient Active Problem List   Diagnosis Date Noted  . Vacuum-assisted vaginal delivery (12/14) 05/29/2019  . 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 Atypical squamous cells of undetermined significance (ASCUS) on Pap 2019 05/28/2019  . Left arm numbness 04/16/2019    Past Surgical History:  Procedure Laterality Date  . BREAST SURGERY Right    removed fibroid cyst     OB History    Gravida  1   Para  1   Term  1   Preterm      AB      Living  1     SAB      TAB      Ectopic      Multiple  0   Live Births  1           Family History  Problem Relation Age of Onset  . Anxiety disorder Mother   . Lupus Mother   . Healthy Father     Social History   Tobacco Use  .  Smoking status: Never Smoker  . Smokeless tobacco: Never Used  Substance Use Topics  . Alcohol use: Yes    Comment: occ  . Drug use: No    Home Medications Prior to Admission medications   Medication Sig Start Date End Date Taking? Authorizing Provider  ibuprofen (ADVIL) 600 MG tablet Take 1 tablet (600 mg total) by mouth every 6 (six) hours. 05/30/19  Yes Arrie Eastern, CNM  medroxyPROGESTERone Acetate 150 MG/ML SUSY Inject 1 mL into the muscle every 3 (three) months. 07/17/19  Yes [provider]  Prenatal Vit-Fe Fumarate-FA (PRENATAL COMPLETE) 14-0.4 MG TABS Take 1 tablet by mouth daily. Patient not taking: Reported on 11/05/2019 09/26/18   Drenda Freeze, MD    Allergies    Amoxicillin  Review of Systems   Review of Systems  Cardiovascular: Positive for chest pain.  All other systems reviewed and are negative.   Physical Exam Updated Vital Signs BP 104/60 (BP Location: Left Arm)   Pulse 84   Temp 97.9 F (36.6 C) (Oral)   Resp 17   Ht 5\' 5"  (1.651 m)  Wt 74.8 kg   SpO2 100%   BMI 27.46 kg/m   Physical Exam Vitals and nursing note reviewed.  Constitutional:      General: She is not in acute distress.    Appearance: She is well-developed. She is not diaphoretic.  HENT:     Head: Normocephalic and atraumatic.  Cardiovascular:     Rate and Rhythm: Normal rate and regular rhythm.     Heart sounds: No murmur. No friction rub. No gallop.   Pulmonary:     Effort: Pulmonary effort is normal. No respiratory distress.     Breath sounds: Normal breath sounds. No wheezing.  Abdominal:     General: Bowel sounds are normal. There is no distension.     Palpations: Abdomen is soft.     Tenderness: There is no abdominal tenderness.  Musculoskeletal:        General: Normal range of motion.     Cervical back: Normal range of motion and neck supple.     Right lower leg: No tenderness. No edema.     Left lower leg: No tenderness. No edema.  Skin:    General:  Skin is warm and dry.  Neurological:     Mental Status: She is alert and oriented to person, place, and time.     ED Results / Procedures / Treatments   Labs (all labs ordered are listed, but only abnormal results are displayed) Labs Reviewed  CBC - Abnormal; Notable for the following components:      Result Value   WBC 11.7 (*)    Hemoglobin 11.9 (*)    All other components within normal limits  I-STAT BETA HCG BLOOD, ED ([facility], [facility], [facility] ONLY) - Abnormal; Notable for the following components:   I-stat hCG, quantitative 5.5 (*)    All other components within normal limits  BASIC METABOLIC PANEL  TROPONIN I (HIGH SENSITIVITY)    EKG EKG Interpretation  Date/Time:  Monday Nov 05 2019 10:52:35 EDT Ventricular Rate:  70 PR Interval:    QRS Duration: 95 QT Interval:  377 QTC Calculation: 407 R Axis:   71 Text Interpretation: Sinus rhythm Normal ECG Confirmed by Veryl Speak [phone]) on 11/05/2019 11:33:40 AM   Radiology DG Chest 2 View  Result Date: 11/05/2019 CLINICAL DATA:  Chest pain EXAM: CHEST - 2 VIEW COMPARISON:  None. FINDINGS: The heart size and mediastinal contours are within normal limits. Both lungs are clear. No pleural effusion or pneumothorax. The visualized skeletal structures are unremarkable. IMPRESSION: No acute process in the chest Electronically Signed   By: Macy Mis M.D.   On: 11/05/2019 11:09    Procedures Procedures (including critical care time)  Medications Ordered in ED Medications  sodium chloride flush (NS) 0.9 % injection 3 mL ( Intravenous Canceled Entry 11/05/19 1226)    ED Course  I have reviewed the triage vital signs and the nursing notes.  Pertinent labs & imaging results that were available during my care of the patient were reviewed by me and considered in my medical decision making (see chart for details).    MDM Rules/Calculators/A&P  Patient presents here with complaints of sharp left-sided chest pain that began acutely this  morning.  This episode lasted approximately 1 minute, then resolved spontaneously.  Patient arrived here with stable vital signs and oxygen saturations of 100%.  Work-up shows no significant abnormality.  Her EKG is unchanged and troponin is negative.  Chest x-ray shows no evidence for pneumothorax or other obvious abnormality.  She is not tachycardic and oxygen saturations are 100%.  I highly doubt pulmonary embolism and she has no risk factors for this.  I feel as though acute pathology has been ruled out and that she can be safely discharged.  Patient will be advised to take ibuprofen as needed for pain and return to the ER if symptoms worsen or change.  I highly suspect a musculoskeletal etiology.  Patient is 24 years old and has no risk factors.  I highly doubt a cardiac etiology.  Final Clinical Impression(s) / ED Diagnoses Final diagnoses:  None    Rx / DC Orders ED Discharge Orders    None       Veryl Speak, MD 11/05/19 1349

## 2019-11-05 NOTE — ED Triage Notes (Addendum)
BIB EMS 24 yo chest pain started at 7 am, no other symptoms. Normal EKG. Pt ambulatory upon arrival.

## 2019-11-05 NOTE — Discharge Instructions (Addendum)
Take ibuprofen 600 mg every 6 hours as needed for pain.  Return to the emergency department if you develop worsening chest pain, difficulty breathing, high fever, or other new and concerning symptoms.

## 2020-02-27 ENCOUNTER — Encounter (HOSPITAL_COMMUNITY): Payer: Self-pay

## 2020-02-27 ENCOUNTER — Other Ambulatory Visit: Payer: Self-pay

## 2020-02-27 ENCOUNTER — Ambulatory Visit (HOSPITAL_COMMUNITY)
Admission: EM | Admit: 2020-02-27 | Discharge: 2020-02-27 | Disposition: A | Discharge status: Home / Self Care | Payer: Medicaid Other | Attending: Family Medicine | Admitting: Family Medicine

## 2020-02-27 DIAGNOSIS — Z3202 Encounter for pregnancy test, result negative: Secondary | ICD-10-CM

## 2020-02-27 DIAGNOSIS — N760 Acute vaginitis: Secondary | ICD-10-CM | POA: Insufficient documentation

## 2020-02-27 LAB — POCT URINALYSIS DIPSTICK, ED / UC
Bilirubin Urine: NEGATIVE
Glucose, UA: NEGATIVE mg/dL
Hgb urine dipstick: NEGATIVE
Ketones, ur: NEGATIVE mg/dL
Leukocytes,Ua: NEGATIVE
Nitrite: NEGATIVE
Protein, ur: NEGATIVE mg/dL
Specific Gravity, Urine: >=1.03 (ref 1.005–1.030)
Urobilinogen, UA: 0.2 mg/dL (ref 0.0–1.0)
pH: 6.5 (ref 5.0–8.0)

## 2020-02-27 LAB — POC URINE PREG, ED: Preg Test, Ur: NEGATIVE

## 2020-02-27 MED ORDER — NYSTATIN-TRIAMCINOLONE 100000-0.1 UNIT/GM-% EX CREA: TOPICAL_CREAM | CUTANEOUS | 0 refills | Status: DC

## 2020-02-27 MED ORDER — FLUCONAZOLE 150 MG PO TABS: 150.0000 mg | ORAL_TABLET | Freq: Every day | ORAL | 0 refills | Status: DC

## 2020-02-27 MED ORDER — CETIRIZINE HCL 10 MG PO TABS: 10.0000 mg | ORAL_TABLET | Freq: Every day | ORAL | 0 refills | Status: DC

## 2020-02-27 NOTE — Discharge Instructions (Signed)
Treating you for yeast infection Medicine as prescribed  Swab sent for testing.  My chart for results.

## 2020-02-27 NOTE — ED Triage Notes (Signed)
Pt c/o vaginal irritation, itching for approx 3 days. Monistat used and pt feels it has made sx worse. Reports red bumps within vaginal area.  Has also used hemorrhoid cream to hemorrhoids.  Denies vaginal discharge, fever, chills, n/v/d, dysuria sx.

## 2020-02-28 ENCOUNTER — Telehealth (HOSPITAL_COMMUNITY): Payer: Self-pay | Admitting: Emergency Medicine

## 2020-02-28 LAB — CERVICOVAGINAL ANCILLARY ONLY
Bacterial Vaginitis (gardnerella): POSITIVE — AB
Candida Glabrata: NEGATIVE
Candida Vaginitis: NEGATIVE
Chlamydia: NEGATIVE
Comment: NEGATIVE
Comment: NEGATIVE
Comment: NEGATIVE
Comment: NEGATIVE
Comment: NEGATIVE
Comment: NORMAL
Neisseria Gonorrhea: NEGATIVE
Trichomonas: NEGATIVE

## 2020-02-28 MED ORDER — METRONIDAZOLE 500 MG PO TABS: 500.0000 mg | ORAL_TABLET | Freq: Two times a day (BID) | ORAL | 0 refills | Status: DC

## 2020-02-28 NOTE — ED Provider Notes (Signed)
[facility]    CSN: [id number] Arrival date & time: 02/27/20  0900      History   Chief Complaint Chief Complaint  Patient presents with  . Vaginitis    HPI Patricia Greer is a 24 y.o. female.   Patient is a 24 year old female presents today with vaginal itching, irritation for approximate 3 days.  Has been using Monistat without much relief.  No specific vaginal discharge, abdominal pain, urinary symptoms.  Also has been using hemorrhoid creams due to active hemorrhoids.  This is improved.     Past Medical History:  Diagnosis Date  . Chlamydia   . Infection    UTI  . Medical history non-contributory   . Numbness and tingling   . Trichomonas vaginitis     Patient Active Problem List   Diagnosis Date Noted  . Vacuum-assisted vaginal delivery (12/14) 05/29/2019  . 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 Atypical squamous cells of undetermined significance (ASCUS) on Pap 2019 05/28/2019  . Left arm numbness 04/16/2019    Past Surgical History:  Procedure Laterality Date  . BREAST SURGERY Right    removed fibroid cyst    OB History    Gravida  1   Para  1   Term  1   Preterm      AB      Living  1     SAB      TAB      Ectopic      Multiple  0   Live Births  1            Home Medications    Prior to Admission medications   Medication Sig Start Date End Date Taking? Authorizing Provider  cetirizine (ZYRTEC) 10 MG tablet Take 1 tablet (10 mg total) by mouth daily. 02/27/20   Loura Halt A, NP  fluconazole (DIFLUCAN) 150 MG tablet Take 1 tablet (150 mg total) by mouth daily. 02/27/20   Loura Halt A, NP  ibuprofen (ADVIL) 600 MG tablet Take 1 tablet (600 mg total) by mouth every 6 (six) hours. 05/30/19   Arrie Eastern, CNM  medroxyPROGESTERone Acetate 150 MG/ML SUSY Inject 1 mL into the muscle every 3 (three) months. 07/17/19   [provider]  nystatin-triamcinolone (MYCOLOG II) cream Apply to affected area  daily 02/27/20   Orvan July, NP  Prenatal Vit-Fe Fumarate-FA (PRENATAL COMPLETE) 14-0.4 MG TABS Take 1 tablet by mouth daily. Patient not taking: Reported on 11/05/2019 09/26/18   Drenda Freeze, MD    Family History Family History  Problem Relation Age of Onset  . Anxiety disorder Mother   . Lupus Mother   . Healthy Father     Social History Social History   Tobacco Use  . Smoking status: Never Smoker  . Smokeless tobacco: Never Used  Vaping Use  . Vaping Use: Never used  Substance Use Topics  . Alcohol use: Yes    Comment: occ  . Drug use: No     Allergies   Amoxicillin   Review of Systems Review of Systems   Physical Exam Triage Vital Signs ED Triage Vitals  Enc Vitals Group     BP 02/27/20 1013 (!) 154/101     Pulse Rate 02/27/20 1013 82     Resp 02/27/20 1013 18     Temp 02/27/20 1013 99.1 F (37.3 C)     Temp Source 02/27/20 1013 Oral     SpO2 02/27/20 1013  98 %     Weight --      Height --      Head Circumference --      Peak Flow --      Pain Score 02/27/20 1012 4     Pain Loc --      Pain Edu? --      Excl. in [place]? --    No data found.  Updated Vital Signs BP (!) 154/101 (BP Location: Right Arm)   Pulse 82   Temp 99.1 F (37.3 C) (Oral)   Resp 18   SpO2 98%   Breastfeeding No   Visual Acuity Right Eye Distance:   Left Eye Distance:   Bilateral Distance:    Right Eye Near:   Left Eye Near:    Bilateral Near:     Physical Exam Vitals and nursing note reviewed.  Constitutional:      General: She is not in acute distress.    Appearance: Normal appearance. She is not ill-appearing, toxic-appearing or diaphoretic.  HENT:     Head: Normocephalic.     Nose: Nose normal.  Eyes:     Conjunctiva/sclera: Conjunctivae normal.  Pulmonary:     Effort: Pulmonary effort is normal.  Musculoskeletal:        General: Normal range of motion.     Cervical back: Normal range of motion.  Skin:    General: Skin is warm and dry.      Findings: No rash.  Neurological:     Mental Status: She is alert.  Psychiatric:        Mood and Affect: Mood normal.      UC Treatments / Results  Labs (all labs ordered are listed, but only abnormal results are displayed) Labs Reviewed  POCT URINALYSIS DIPSTICK, ED / UC  POC URINE PREG, ED  CERVICOVAGINAL ANCILLARY ONLY    EKG   Radiology No results found.  Procedures Procedures (including critical care time)  Medications Ordered in UC Medications - No data to display  Initial Impression / Assessment and Plan / UC Course  I have reviewed the triage vital signs and the nursing notes.  Pertinent labs & imaging results that were available during my care of the patient were reviewed by me and considered in my medical decision making (see chart for details).     Vaginitis Treating for yeast infection based on symptoms and history.  Medicine as prescribed. Follow up as needed for continued or worsening symptoms  Final Clinical Impressions(s) / UC Diagnoses   Final diagnoses:  Vaginitis and vulvovaginitis     Discharge Instructions     Treating you for yeast infection Medicine as prescribed  Swab sent for testing.  My chart for results.    ED Prescriptions    Medication Sig Dispense Auth. Provider   nystatin-triamcinolone (MYCOLOG II) cream Apply to affected area daily 15 g Marquie Aderhold A, NP   fluconazole (DIFLUCAN) 150 MG tablet Take 1 tablet (150 mg total) by mouth daily. 2 tablet Clancy Leiner A, NP   cetirizine (ZYRTEC) 10 MG tablet Take 1 tablet (10 mg total) by mouth daily. 30 tablet Loura Halt A, NP     PDMP not reviewed this encounter.   Loura Halt A, NP 02/28/20 [phone]

## 2020-08-08 ENCOUNTER — Ambulatory Visit (HOSPITAL_COMMUNITY)
Admission: EM | Admit: 2020-08-08 | Discharge: 2020-08-08 | Disposition: A | Discharge status: Home / Self Care | Payer: Medicaid Other | Attending: Student | Admitting: Student

## 2020-08-08 ENCOUNTER — Other Ambulatory Visit: Payer: Self-pay

## 2020-08-08 ENCOUNTER — Encounter (HOSPITAL_COMMUNITY): Payer: Self-pay

## 2020-08-08 DIAGNOSIS — J069 Acute upper respiratory infection, unspecified: Secondary | ICD-10-CM | POA: Insufficient documentation

## 2020-08-08 DIAGNOSIS — Z1152 Encounter for screening for COVID-19: Secondary | ICD-10-CM | POA: Diagnosis not present

## 2020-08-08 MED ORDER — LIDOCAINE VISCOUS HCL 2 % MT SOLN: 15.0000 mL | OROMUCOSAL | 0 refills | Status: DC | PRN

## 2020-08-08 MED ORDER — PROMETHAZINE-DM 6.25-15 MG/5ML PO SYRP: 5.0000 mL | ORAL_SOLUTION | Freq: Four times a day (QID) | ORAL | 0 refills | Status: DC | PRN

## 2020-08-08 MED ORDER — BENZONATATE 100 MG PO CAPS: 100.0000 mg | ORAL_CAPSULE | Freq: Three times a day (TID) | ORAL | 0 refills | Status: DC

## 2020-08-08 NOTE — ED Provider Notes (Signed)
[facility]    CSN: [id number] Arrival date & time: 08/08/20  1929      History   Chief Complaint Chief Complaint  Patient presents with  . Cough  . Sore Throat  . Headache    HPI Patricia Greer is a 25 y.o. female presenting with cough, sore throat, headache x2 days. History chlamydia, UTI, vaginitis. Denies fevers/chills, n/v/d, shortness of breath, chest pain, , congestion, facial pain, teeth pain,,  loss of taste/smell, swollen lymph nodes, ear pain. Denies worst headache of life, vision changes, photophobia. OTC medications providing some relief.   HPI  Past Medical History:  Diagnosis Date  . Chlamydia   . Infection    UTI  . Medical history non-contributory   . Numbness and tingling   . Trichomonas vaginitis     Patient Active Problem List   Diagnosis Date Noted  . Vacuum-assisted vaginal delivery (12/14) 05/29/2019  . 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 Atypical squamous cells of undetermined significance (ASCUS) on Pap 2019 05/28/2019  . Left arm numbness 04/16/2019    Past Surgical History:  Procedure Laterality Date  . BREAST SURGERY Right    removed fibroid cyst    OB History    Gravida  1   Para  1   Term  1   Preterm      AB      Living  1     SAB      IAB      Ectopic      Multiple  0   Live Births  1            Home Medications    Prior to Admission medications   Medication Sig Start Date End Date Taking? Authorizing Provider  benzonatate (TESSALON) 100 MG capsule Take 1 capsule (100 mg total) by mouth every 8 (eight) hours. 08/08/20  Yes Hazel Sams, PA-C  lidocaine (XYLOCAINE) 2 % solution Use as directed 15 mLs in the mouth or throat as needed for mouth pain. 08/08/20  Yes Hazel Sams, PA-C  promethazine-dextromethorphan (PROMETHAZINE-DM) 6.25-15 MG/5ML syrup Take 5 mLs by mouth 4 (four) times daily as needed for cough. 08/08/20  Yes Hazel Sams, PA-C  cetirizine (ZYRTEC) 10 MG  tablet Take 1 tablet (10 mg total) by mouth daily. 02/27/20   Loura Halt A, NP  fluconazole (DIFLUCAN) 150 MG tablet Take 1 tablet (150 mg total) by mouth daily. 02/27/20   Loura Halt A, NP  ibuprofen (ADVIL) 600 MG tablet Take 1 tablet (600 mg total) by mouth every 6 (six) hours. 05/30/19   Arrie Eastern, CNM  medroxyPROGESTERone Acetate 150 MG/ML SUSY Inject 1 mL into the muscle every 3 (three) months. 07/17/19   [provider]  metroNIDAZOLE (FLAGYL) 500 MG tablet Take 1 tablet (500 mg total) by mouth 2 (two) times daily. 02/28/20   Chase Picket, MD  nystatin-triamcinolone (MYCOLOG II) cream Apply to affected area daily 02/27/20   Orvan July, NP  Prenatal Vit-Fe Fumarate-FA (PRENATAL COMPLETE) 14-0.4 MG TABS Take 1 tablet by mouth daily. Patient not taking: Reported on 11/05/2019 09/26/18   Drenda Freeze, MD    Family History Family History  Problem Relation Age of Onset  . Anxiety disorder Mother   . Lupus Mother   . Healthy Father     Social History Social History   Tobacco Use  . Smoking status: Never Smoker  . Smokeless tobacco: Never Used  Vaping Use  . Vaping Use: Never used  Substance Use Topics  . Alcohol use: Yes    Comment: occ  . Drug use: No     Allergies   Amoxicillin   Review of Systems Review of Systems  Constitutional: Negative for appetite change, chills and fever.  HENT: Positive for congestion and sore throat. Negative for ear pain, rhinorrhea, sinus pressure and sinus pain.   Eyes: Negative for redness and visual disturbance.  Respiratory: Positive for cough. Negative for chest tightness, shortness of breath and wheezing.   Cardiovascular: Negative for chest pain and palpitations.  Gastrointestinal: Negative for abdominal pain, constipation, diarrhea, nausea and vomiting.  Genitourinary: Negative for dysuria, frequency and urgency.  Musculoskeletal: Negative for myalgias.  Neurological: Negative for dizziness, weakness and  headaches.  Psychiatric/Behavioral: Negative for confusion.  All other systems reviewed and are negative.    Physical Exam Triage Vital Signs ED Triage Vitals  Enc Vitals Group     BP 08/08/20 1947 117/75     Pulse Rate 08/08/20 1947 85     Resp 08/08/20 1947 17     Temp 08/08/20 1947 98.3 F (36.8 C)     Temp Source 08/08/20 1947 Oral     SpO2 08/08/20 1947 100 %     Weight --      Height --      Head Circumference --      Peak Flow --      Pain Score 08/08/20 1948 7     Pain Loc --      Pain Edu? --      Excl. in [place]? --    No data found.  Updated Vital Signs BP 117/75 (BP Location: Right Arm)   Pulse 85   Temp 98.3 F (36.8 C) (Oral)   Resp 17   SpO2 100%   Visual Acuity Right Eye Distance:   Left Eye Distance:   Bilateral Distance:    Right Eye Near:   Left Eye Near:    Bilateral Near:     Physical Exam Vitals reviewed.  Constitutional:      General: She is not in acute distress.    Appearance: Normal appearance. She is not ill-appearing.  HENT:     Head: Normocephalic and atraumatic.     Right Ear: Hearing, tympanic membrane, ear canal and external ear normal. No swelling or tenderness. There is no impacted cerumen. No mastoid tenderness. Tympanic membrane is not perforated, erythematous, retracted or bulging.     Left Ear: Hearing, tympanic membrane, ear canal and external ear normal. No swelling or tenderness. There is no impacted cerumen. No mastoid tenderness. Tympanic membrane is not perforated, erythematous, retracted or bulging.     Nose:     Right Sinus: No maxillary sinus tenderness or frontal sinus tenderness.     Left Sinus: No maxillary sinus tenderness or frontal sinus tenderness.     Mouth/Throat:     Mouth: Mucous membranes are moist.     Pharynx: Uvula midline. No oropharyngeal exudate or posterior oropharyngeal erythema.     Tonsils: No tonsillar exudate.  Cardiovascular:     Rate and Rhythm: Normal rate and regular rhythm.     Heart  sounds: Normal heart sounds.  Pulmonary:     Breath sounds: Normal breath sounds and air entry. No wheezing, rhonchi or rales.  Chest:     Chest wall: No tenderness.  Abdominal:     General: Abdomen is flat. Bowel sounds are normal.  Tenderness: There is no abdominal tenderness. There is no guarding or rebound.  Lymphadenopathy:     Cervical: No cervical adenopathy.  Neurological:     General: No focal deficit present.     Mental Status: She is alert and oriented to person, place, and time.  Psychiatric:        Attention and Perception: Attention and perception normal.        Mood and Affect: Mood and affect normal.        Behavior: Behavior normal. Behavior is cooperative.        Thought Content: Thought content normal.        Judgment: Judgment normal.      UC Treatments / Results  Labs (all labs ordered are listed, but only abnormal results are displayed) Labs Reviewed  SARS CORONAVIRUS 2 (TAT 6-24 HRS)    EKG   Radiology No results found.  Procedures Procedures (including critical care time)  Medications Ordered in UC Medications - No data to display  Initial Impression / Assessment and Plan / UC Course  I have reviewed the triage vital signs and the nursing notes.  Pertinent labs & imaging results that were available during my care of the patient were reviewed by me and considered in my medical decision making (see chart for details).     This patient is a 25 year old female presenting with URI symptoms.   Covid test sent. Isolation as per CDC guidelines.  Centor score 0, rapid strep deferred. For sore throat, lidocaine mouthwash as below.  For headaches, tylenol/ibuprofen.  For cough, promethazine and tessalon.  Return precautions discussed.   Spent over 30 minutes obtaining H&P, performing physical, discussing treatment plan and plan for follow-up with patient. Patient agrees with plan.     Final Clinical Impressions(s) / UC Diagnoses   Final  diagnoses:  Viral URI with cough  Encounter for screening for COVID-19     Discharge Instructions     -Start the medications:  -Tessalon as needed for cough. Take one pill up to 3x daily (every 8 hours) -Promethazine DM cough syrup for congestion/cough. This could make you drowsy, so take at night before bed. -For sore throat, use lidocaine mouthwash up to every 4 hours. Make sure not to eat for at least 1 hour after using this, as your mouth will be very numb and you could bite yourself. -Tylenol and ibuprofen for fevers/chills, headaches, body aches. You can take tylenol up to 1000mg  3x daily. You can take ibuprofen 800mg  up to 3x daily. Make sure to take ibuprofen with food.  -We are currently awaiting result of your PCR covid-19 test. This typically comes back in 1-2 days. We'll call you if the result is positive. Otherwise, the result will be sent electronically to your MyChart. You can also call this clinic and ask for your result via telephone.   Please isolate at home while awaiting these results. If your test is positive for Covid-19, continue to isolate at home for 5 days if you have mild symptoms, or a total of 10 days from symptom onset if you have more severe symptoms. If you quarantine for a shorter period of time (i.e. 5 days), make sure to wear a mask until day 10 of symptoms. Treat your symptoms at home with OTC remedies like tylenol/ibuprofen, mucinex, nyquil, etc. Seek medical attention if you develop high fevers, chest pain, shortness of breath, ear pain, facial pain, etc. Make sure to get up and move around every 2-3 hours  while convalescing to help prevent blood clots. Drink plenty of fluids, and rest as much as possible.    ED Prescriptions    Medication Sig Dispense Auth. Provider   benzonatate (TESSALON) 100 MG capsule Take 1 capsule (100 mg total) by mouth every 8 (eight) hours. 21 capsule Hazel Sams, PA-C   promethazine-dextromethorphan (PROMETHAZINE-DM) 6.25-15  MG/5ML syrup Take 5 mLs by mouth 4 (four) times daily as needed for cough. 118 mL Marin Roberts E, PA-C   lidocaine (XYLOCAINE) 2 % solution Use as directed 15 mLs in the mouth or throat as needed for mouth pain. 100 mL Hazel Sams, PA-C     PDMP not reviewed this encounter.   Hazel Sams, PA-C 08/08/20 2054

## 2020-08-08 NOTE — Discharge Instructions (Addendum)
-  Start the medications:  -Tessalon as needed for cough. Take one pill up to 3x daily (every 8 hours) -Promethazine DM cough syrup for congestion/cough. This could make you drowsy, so take at night before bed. -For sore throat, use lidocaine mouthwash up to every 4 hours. Make sure not to eat for at least 1 hour after using this, as your mouth will be very numb and you could bite yourself. -Tylenol and ibuprofen for fevers/chills, headaches, body aches. You can take tylenol up to 1000mg  3x daily. You can take ibuprofen 800mg  up to 3x daily. Make sure to take ibuprofen with food.  -We are currently awaiting result of your PCR covid-19 test. This typically comes back in 1-2 days. We'll call you if the result is positive. Otherwise, the result will be sent electronically to your MyChart. You can also call this clinic and ask for your result via telephone.   Please isolate at home while awaiting these results. If your test is positive for Covid-19, continue to isolate at home for 5 days if you have mild symptoms, or a total of 10 days from symptom onset if you have more severe symptoms. If you quarantine for a shorter period of time (i.e. 5 days), make sure to wear a mask until day 10 of symptoms. Treat your symptoms at home with OTC remedies like tylenol/ibuprofen, mucinex, nyquil, etc. Seek medical attention if you develop high fevers, chest pain, shortness of breath, ear pain, facial pain, etc. Make sure to get up and move around every 2-3 hours while convalescing to help prevent blood clots. Drink plenty of fluids, and rest as much as possible.

## 2020-08-08 NOTE — ED Triage Notes (Signed)
Pt presents with non productive cough, sore throat, and headache X 2 days.

## 2020-08-09 LAB — SARS CORONAVIRUS 2 (TAT 6-24 HRS): SARS Coronavirus 2: NEGATIVE

## 2020-11-28 ENCOUNTER — Other Ambulatory Visit: Payer: Self-pay | Admitting: Obstetrics & Gynecology

## 2020-12-04 ENCOUNTER — Other Ambulatory Visit: Payer: Medicaid Other

## 2020-12-04 ENCOUNTER — Other Ambulatory Visit: Payer: Self-pay | Admitting: Family Medicine

## 2020-12-04 DIAGNOSIS — R053 Chronic cough: Secondary | ICD-10-CM

## 2021-04-17 ENCOUNTER — Emergency Department (HOSPITAL_BASED_OUTPATIENT_CLINIC_OR_DEPARTMENT_OTHER): Payer: No Typology Code available for payment source

## 2021-04-17 ENCOUNTER — Encounter (HOSPITAL_BASED_OUTPATIENT_CLINIC_OR_DEPARTMENT_OTHER): Payer: Self-pay | Admitting: Emergency Medicine

## 2021-04-17 ENCOUNTER — Other Ambulatory Visit: Payer: Self-pay

## 2021-04-17 DIAGNOSIS — R519 Headache, unspecified: Secondary | ICD-10-CM | POA: Insufficient documentation

## 2021-04-17 DIAGNOSIS — Y92410 Unspecified street and highway as the place of occurrence of the external cause: Secondary | ICD-10-CM | POA: Diagnosis not present

## 2021-04-17 DIAGNOSIS — S161XXA Strain of muscle, fascia and tendon at neck level, initial encounter: Secondary | ICD-10-CM | POA: Insufficient documentation

## 2021-04-17 DIAGNOSIS — S199XXA Unspecified injury of neck, initial encounter: Secondary | ICD-10-CM | POA: Diagnosis present
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
## 2021-04-18 ENCOUNTER — Emergency Department (HOSPITAL_BASED_OUTPATIENT_CLINIC_OR_DEPARTMENT_OTHER)
Admission: EM | Admit: 2021-04-18 | Discharge: 2021-04-18 | Disposition: A | Discharge status: Home / Self Care | Payer: No Typology Code available for payment source | Attending: Emergency Medicine | Admitting: Emergency Medicine

## 2021-04-18 DIAGNOSIS — S161XXA Strain of muscle, fascia and tendon at neck level, initial encounter: Secondary | ICD-10-CM

## 2021-04-18 MED ORDER — NAPROXEN 250 MG PO TABS
500.0000 mg | ORAL_TABLET | Freq: Once | ORAL | Status: AC
  Administered 2021-04-18: 500 mg via ORAL
  Filled 2021-04-18: qty 2

## 2021-04-18 MED ORDER — CYCLOBENZAPRINE HCL 10 MG PO TABS
10.0000 mg | ORAL_TABLET | Freq: Once | ORAL | Status: AC
  Administered 2021-04-18: 10 mg via ORAL
  Filled 2021-04-18: qty 1

## 2021-04-18 MED ORDER — NAPROXEN 375 MG PO TABS: ORAL_TABLET | ORAL | 0 refills | Status: AC

## 2021-04-18 MED ORDER — CYCLOBENZAPRINE HCL 10 MG PO TABS: 10.0000 mg | ORAL_TABLET | Freq: Three times a day (TID) | ORAL | 0 refills | Status: AC | PRN
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
## 2021-04-18 NOTE — ED Notes (Signed)
Pt verbalizes understanding of discharge instructions. Opportunity for questioning and answers were provided. Armand removed by staff, pt discharged from ED to home. Educated to pick up Rx.  

## 2021-08-27 ENCOUNTER — Other Ambulatory Visit: Payer: Self-pay

## 2021-08-27 ENCOUNTER — Emergency Department (HOSPITAL_COMMUNITY)
Admission: EM | Admit: 2021-08-27 | Discharge: 2021-08-27 | Disposition: A | Discharge status: Home / Self Care | Payer: No Typology Code available for payment source | Attending: Emergency Medicine | Admitting: Emergency Medicine

## 2021-08-27 ENCOUNTER — Encounter (HOSPITAL_COMMUNITY): Payer: Self-pay

## 2021-08-27 ENCOUNTER — Emergency Department (HOSPITAL_COMMUNITY): Payer: No Typology Code available for payment source

## 2021-08-27 DIAGNOSIS — S8991XA Unspecified injury of right lower leg, initial encounter: Secondary | ICD-10-CM | POA: Diagnosis not present

## 2021-08-27 DIAGNOSIS — Y92410 Unspecified street and highway as the place of occurrence of the external cause: Secondary | ICD-10-CM | POA: Diagnosis not present

## 2021-08-27 MED ORDER — IBUPROFEN 400 MG PO TABS
600.0000 mg | ORAL_TABLET | Freq: Once | ORAL | Status: AC
  Administered 2021-08-27: 600 mg via ORAL
  Filled 2021-08-27: qty 1
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
## 2021-08-27 NOTE — Discharge Instructions (Addendum)
It was a pleasure taking care of you today! ? ?Your imaging in the ED was negative today. You will feel more sore in the morning. You may take over the counter 600 mg Ibuprofen every 6 hours or 1,000 mg Tylenol every 6 hours as needed for pain for no more than 7 days. You may apply ice or heat to affected area for up to 15 minutes at a time. Ensure to place a barrier between your skin and the ice/heat. Return to the Emergency Department if you are experiencing increasing/worsening knee pain, swelling, redness, or worsening symptoms.  ?
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
## 2022-05-20 ENCOUNTER — Emergency Department (HOSPITAL_BASED_OUTPATIENT_CLINIC_OR_DEPARTMENT_OTHER): Payer: Medicaid Other | Admitting: Radiology

## 2022-05-20 ENCOUNTER — Emergency Department (HOSPITAL_BASED_OUTPATIENT_CLINIC_OR_DEPARTMENT_OTHER)
Admission: EM | Admit: 2022-05-20 | Discharge: 2022-05-20 | Disposition: A | Discharge status: Home / Self Care | Payer: Medicaid Other | Attending: Emergency Medicine | Admitting: Emergency Medicine

## 2022-05-20 ENCOUNTER — Encounter (HOSPITAL_BASED_OUTPATIENT_CLINIC_OR_DEPARTMENT_OTHER): Payer: Self-pay

## 2022-05-20 DIAGNOSIS — R059 Cough, unspecified: Secondary | ICD-10-CM | POA: Insufficient documentation

## 2022-05-20 DIAGNOSIS — Z1152 Encounter for screening for COVID-19: Secondary | ICD-10-CM | POA: Insufficient documentation

## 2022-05-20 DIAGNOSIS — R070 Pain in throat: Secondary | ICD-10-CM | POA: Diagnosis present

## 2022-05-20 LAB — RESP PANEL BY RT-PCR (FLU A&B, COVID) ARPGX2
Influenza A by PCR: NEGATIVE
Influenza B by PCR: NEGATIVE
SARS Coronavirus 2 by RT PCR: NEGATIVE

## 2022-05-20 LAB — GROUP A STREP BY PCR: Group A Strep by PCR: NOT DETECTED

## 2022-05-20 LAB — PREGNANCY, URINE: Preg Test, Ur: NEGATIVE

## 2022-05-20 MED ORDER — BENZONATATE 100 MG PO CAPS: 100.0000 mg | ORAL_CAPSULE | Freq: Three times a day (TID) | ORAL | 0 refills | Status: AC

## 2022-05-20 NOTE — Discharge Instructions (Signed)
At this time there does not appear to be the presence of an emergent medical condition, however there is always the potential for conditions to change. Please read and follow the below instructions.  Please return to the Emergency Department immediately for any new or worsening symptoms. Please be sure to follow up with your Primary Care Provider within one week regarding your visit today; please call their office to schedule an appointment even if you are feeling better for a follow-up visit. You may use the cough medication Tessalon as prescribed to help with your symptoms.  As we discussed you may also try an allergy medication or over-the-counter heartburn medication as coughs can sometimes be due to allergies or acid reflux.  I advise that you follow-up with your primary care provider in the next week for recheck.   Please read the additional information packets attached to your discharge summary.  Go to the nearest Emergency Department immediately if: You have fever or chills You cough up blood. You have trouble breathing. Your heartbeat is very fast. You have chest pain or shortness of breath You have night sweats or weight loss You have any new/concerning or worsening of symptoms.  Do not take your medicine if  develop an itchy rash, swelling in your mouth or lips, or difficulty breathing; call 911 and seek immediate emergency medical attention if this occurs.  You may review your lab tests and imaging results in their entirety on your MyChart account.  Please discuss all results of fully with your primary care provider and other specialist at your follow-up visit.  Note: Portions of this text may have been transcribed using voice recognition software. Every effort was made to ensure accuracy; however, inadvertent computerized transcription errors may still be present.

## 2022-05-20 NOTE — ED Triage Notes (Signed)
Pt states she has had a cough x 3 months and now has developed a sore throat Has been taking OTC meds to help with symptoms

## 2022-05-20 NOTE — ED Provider Notes (Addendum)
[facility] Provider Note   CSN: [id number] Arrival date & time: 05/20/22  1705     History  Chief Complaint  Patient presents with   Sore Throat    [facility] Greer is a 26 y.o. female reports as otherwise healthy presented for evaluation of intermittent cough x 3 months she reports cough is nonproductive.  She has tried several over-the-counter medications without improvement.  She reports that when she has severe cough she will have a scratchy sore throat which is mild and does not radiate and improves when she stops coughing.  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  Patient denies fever, chills, chest pain/shortness of breath, hemoptysis, history of blood clot, extremity swelling/color change, abdominal pain, nausea, vomiting or any additional concerns.  HPI     Home Medications Prior to Admission medications   Medication Sig Start Date End Date Taking? Authorizing Provider  benzonatate (TESSALON) 100 MG capsule Take 1 capsule (100 mg total) by mouth every 8 (eight) hours for 5 days. 05/20/22 05/25/22 Yes Nuala Alpha A, PA-C  cyclobenzaprine (FLEXERIL) 10 MG tablet Take 1 tablet (10 mg total) by mouth 3 (three) times daily as needed for muscle spasms. 04/18/21   Molpus, John, MD  medroxyPROGESTERone Acetate 150 MG/ML SUSY Inject 1 mL into the muscle every 3 (three) months. 07/17/19   [provider]  naproxen (NAPROSYN) 375 MG tablet Take 1 tablet twice daily as needed for pain. 04/18/21   Molpus, Jenny Reichmann, MD      Allergies    Amoxicillin    Review of Systems   Review of Systems  Constitutional: Negative.  Negative for chills and fever.  HENT:  Positive for sore throat.   Respiratory:  Positive for cough. Negative for shortness of breath.   Cardiovascular: Negative.  Negative for chest pain.  Gastrointestinal: Negative.  Negative for abdominal distention.    Physical Exam Updated Vital  Signs BP 130/80 (BP Location: Right Arm)   Pulse 96   Temp 98.8 F (37.1 C) (Oral)   Resp 18   Ht '5\' 5"'$  (1.651 m)   Wt 74.8 kg   SpO2 99%   BMI 27.46 kg/m  Physical Exam Constitutional:      General: She is not in acute distress.    Appearance: Normal appearance. She is well-developed. She is not ill-appearing or diaphoretic.  HENT:     Head: Normocephalic and atraumatic.     Nose: Nose normal.     Mouth/Throat:     Mouth: Mucous membranes are moist.     Pharynx: Oropharynx is clear. Uvula midline. No pharyngeal swelling.     Tonsils: No tonsillar exudate.     Comments: Mild posterior pharynx cobblestoning Eyes:     General: Vision grossly intact. Gaze aligned appropriately.     Pupils: Pupils are equal, round, and reactive to light.  Neck:     Trachea: Trachea and phonation normal.  Cardiovascular:     Rate and Rhythm: Normal rate and regular rhythm.     Pulses: Normal pulses.  Pulmonary:     Effort: Pulmonary effort is normal. No respiratory distress.     Breath sounds: Normal breath sounds.  Abdominal:     General: There is no distension.     Palpations: Abdomen is soft.     Tenderness: There is no abdominal tenderness. There is no guarding or rebound.  Musculoskeletal:        General: Normal range  of motion.     Cervical back: Normal range of motion.     Right lower leg: No edema.     Left lower leg: No edema.  Skin:    General: Skin is warm and dry.  Neurological:     Mental Status: She is alert.     GCS: GCS eye subscore is 4. GCS verbal subscore is 5. GCS motor subscore is 6.     Comments: Speech is clear and goal oriented, follows commands Major Cranial nerves without deficit, no facial droop Moves extremities without ataxia, coordination intact  Psychiatric:        Behavior: Behavior normal.     ED Results / Procedures / Treatments   Labs (all labs ordered are listed, but only abnormal results are displayed) Labs Reviewed  RESP PANEL BY RT-PCR (FLU  A&B, COVID) ARPGX2  GROUP A STREP BY PCR  PREGNANCY, URINE  POC URINE PREG, ED    EKG None  Radiology DG Chest 2 View  Result Date: 05/20/2022 CLINICAL DATA:  Cough. EXAM: CHEST - 2 VIEW COMPARISON:  Chest radiograph dated 11/05/2019. FINDINGS: No focal consolidation, pleural effusion, or pneumothorax. The cardiac silhouette is within normal limits. No acute osseous pathology. IMPRESSION: No active cardiopulmonary disease. Electronically Signed   By: Anner Crete M.D.   On: 05/20/2022 21:02    Procedures Procedures    Medications Ordered in ED Medications - No data to display  ED Course/ Medical Decision Making/ A&P                           Medical Decision Making 25 year old female presenting for 3 months of nonproductive cough.  She reports a mild scratchy sore throat when she is coughing.  She denies any other symptoms she is concerned as she has tried over-the-counter medications without improvement.  She does report working at a daycare but no known sick exposures.  On evaluation patient is well-appearing and in no acute distress, cranial nerves intact, no meningeal signs, airway with mild posterior pharynx cobblestoning no tonsillar swelling or exudate, airway clear.  Cardiopulmonary examination is unremarkable.  Abdomen soft nontender.  Neurovascular intact to all 4 extremities without evidence of DVT.  No history of COPD/asthma or ACE inhibitor use.  Differential includes but not limited to viral URI, bronchitis, GERD, allergies.  Low suspicion for PE, PTX, PNA or other emergent causes of cough.  Amount and/or Complexity of Data Reviewed Labs: ordered.    Details: COVID/influenza panel negative Strep test negative Pregnancy test negative  Radiology: ordered.    Details: I have personally reviewed and interpreted patient's 2 view chest x-ray.  I do not appreciate any obvious PTX, PNA, effusion or other acute cardiopulmonary process.  Risk OTC drugs. Prescription  drug management. Risk Details: Patient reassessed she is well-appearing and in no acute distress.  She is sleeping comfortably but easily arousable to voice. VSS.  Unclear cause of patient's 3 months of cough at this point suspect she may be experiencing URIs she does work in a daycare.  She was also informed that this could be due to allergies or possibly GERD.  Patient is going to try over-the-counter allergy and GERD medications.  She will be prescribed Tessalon to help with her cough as well.  I encouraged her to follow-up closely with her primary care provider.  She has no chest pain or shortness of breath or other concerning symptoms at this point, there is no indication  for further imaging or for blood work today.  Low suspicion for PTX, PNA, anaphylaxis or other emergent causes at this time.     At this time there does not appear to be any evidence of an acute emergency medical condition and the patient appears stable for discharge with appropriate outpatient follow up. Diagnosis was discussed with patient who verbalizes understanding of care plan and is agreeable to discharge. I have discussed return precautions with patient who verbalizes understanding. Patient encouraged to follow-up with their PCP. All questions answered.    Note: Portions of this report may have been transcribed using voice recognition software. Every effort was made to ensure accuracy; however, inadvertent computerized transcription errors may still be present.         Final Clinical Impression(s) / ED Diagnoses Final diagnoses:  Cough, unspecified type    Rx / DC Orders ED Discharge Orders          Ordered    benzonatate (TESSALON) 100 MG capsule  Every 8 hours        05/20/22 2139              Deliah Boston, PA-C 05/20/22 2141    Deliah Boston, PA-C 05/20/22 2141    Dorie Rank, MD 05/21/22 1454

## 2022-05-28 ENCOUNTER — Emergency Department (HOSPITAL_BASED_OUTPATIENT_CLINIC_OR_DEPARTMENT_OTHER): Payer: Medicaid Other | Admitting: Radiology

## 2022-05-28 ENCOUNTER — Other Ambulatory Visit: Payer: Self-pay

## 2022-05-28 ENCOUNTER — Encounter (HOSPITAL_BASED_OUTPATIENT_CLINIC_OR_DEPARTMENT_OTHER): Payer: Self-pay | Admitting: Emergency Medicine

## 2022-05-28 ENCOUNTER — Other Ambulatory Visit (HOSPITAL_BASED_OUTPATIENT_CLINIC_OR_DEPARTMENT_OTHER): Payer: Self-pay

## 2022-05-28 ENCOUNTER — Emergency Department (HOSPITAL_BASED_OUTPATIENT_CLINIC_OR_DEPARTMENT_OTHER)
Admission: EM | Admit: 2022-05-28 | Discharge: 2022-05-28 | Disposition: A | Discharge status: Home / Self Care | Payer: Medicaid Other | Attending: Emergency Medicine | Admitting: Emergency Medicine

## 2022-05-28 DIAGNOSIS — Z1152 Encounter for screening for COVID-19: Secondary | ICD-10-CM | POA: Insufficient documentation

## 2022-05-28 DIAGNOSIS — R509 Fever, unspecified: Secondary | ICD-10-CM | POA: Diagnosis present

## 2022-05-28 DIAGNOSIS — J101 Influenza due to other identified influenza virus with other respiratory manifestations: Secondary | ICD-10-CM

## 2022-05-28 LAB — RESP PANEL BY RT-PCR (RSV, FLU A&B, COVID)  RVPGX2
Influenza A by PCR: POSITIVE — AB
Influenza B by PCR: NEGATIVE
Resp Syncytial Virus by PCR: NEGATIVE
SARS Coronavirus 2 by RT PCR: NEGATIVE

## 2022-05-28 MED ORDER — OSELTAMIVIR PHOSPHATE 75 MG PO CAPS
75.0000 mg | ORAL_CAPSULE | Freq: Two times a day (BID) | ORAL | 0 refills | Status: AC
  Filled 2022-05-28: qty 10, 5d supply, fill #0

## 2022-05-28 MED ORDER — BENZONATATE 100 MG PO CAPS
100.0000 mg | ORAL_CAPSULE | Freq: Three times a day (TID) | ORAL | 0 refills | Status: AC | PRN
  Filled 2022-05-28: qty 21, 7d supply, fill #0

## 2022-05-28 MED ORDER — IBUPROFEN 800 MG PO TABS
800.0000 mg | ORAL_TABLET | Freq: Once | ORAL | Status: AC
  Administered 2022-05-28: 800 mg via ORAL
  Filled 2022-05-28: qty 1

## 2022-05-28 NOTE — Discharge Instructions (Addendum)
Your flu test today is positive.  Take ibuprofen and Tylenol in alternating fashion to help with fever, body aches, etc.  You are being given a medicine called Tamiflu to help with your symptoms.  If at any point you develop shortness of breath, vomiting, or any other new/concerning symptoms then return to the ER for evaluation.

## 2022-05-28 NOTE — ED Notes (Signed)
Pt provided discharge instructions and prescription information. Pt was given the opportunity to ask questions and questions were answered.   

## 2022-05-28 NOTE — ED Provider Notes (Signed)
[facility] Provider Note   CSN: [id number] Arrival date & time: 05/28/22  6269     History  No chief complaint on file.   [facility] Greer is a 26 y.o. female.  HPI 26 year old female presents with fever, cough. Symptoms started yesterday.  Previous to this she has been dealing with 3 months of cough.  About a week ago she developed sore throat and was seen in the ER and given prescription medicine.  States that her symptoms seem to be improving up until yesterday where she developed fever, cough that is worse, and bodyaches.  She works in [employer].  She does not really feel short of breath.  She has heard some intermittent wheezing and states she had asthma as a child.  The sore throat is actually a lot better.  No chest pain or vomiting.  Occasionally coughs up yellow or clear sputum.  Home Medications Prior to Admission medications   Medication Sig Start Date End Date Taking? Authorizing Provider  oseltamivir (TAMIFLU) 75 MG capsule Take 1 capsule (75 mg total) by mouth every 12 (twelve) hours. 05/28/22  Yes Sherwood Gambler, MD  cyclobenzaprine (FLEXERIL) 10 MG tablet Take 1 tablet (10 mg total) by mouth 3 (three) times daily as needed for muscle spasms. 04/18/21   Molpus, John, MD  medroxyPROGESTERone Acetate 150 MG/ML SUSY Inject 1 mL into the muscle every 3 (three) months. 07/17/19   [provider]  naproxen (NAPROSYN) 375 MG tablet Take 1 tablet twice daily as needed for pain. 04/18/21   Molpus, John, MD      Allergies    Amoxicillin    Review of Systems   Review of Systems  Constitutional:  Positive for fever.  HENT:  Positive for congestion. Negative for sore throat.   Respiratory:  Positive for cough. Negative for shortness of breath.   Cardiovascular:  Negative for chest pain.  Gastrointestinal:  Negative for vomiting.  Musculoskeletal:  Positive for myalgias.    Physical Exam Updated Vital Signs BP 139/86 (BP Location: Right Arm)    Pulse (!) 101   Temp (!) 101.8 F (38.8 C) (Oral)   Resp 16   SpO2 100%  Physical Exam Vitals and nursing note reviewed.  Constitutional:      General: She is not in acute distress.    Appearance: She is well-developed. She is not ill-appearing or diaphoretic.  HENT:     Head: Normocephalic and atraumatic.  Cardiovascular:     Rate and Rhythm: Normal rate and regular rhythm.     Heart sounds: Normal heart sounds.  Pulmonary:     Effort: Pulmonary effort is normal.     Breath sounds: Normal breath sounds. No wheezing.  Abdominal:     General: There is no distension.     Palpations: Abdomen is soft.     Tenderness: There is no abdominal tenderness.  Musculoskeletal:     Cervical back: No rigidity.  Skin:    General: Skin is warm and dry.  Neurological:     Mental Status: She is alert.     ED Results / Procedures / Treatments   Labs (all labs ordered are listed, but only abnormal results are displayed) Labs Reviewed  RESP PANEL BY RT-PCR (RSV, FLU A&B, COVID)  RVPGX2 - Abnormal; Notable for the following components:      Result Value   Influenza A by PCR POSITIVE (*)    All other components within normal limits    EKG None  Radiology DG Chest 2 View  Result Date: 05/28/2022 CLINICAL DATA:  Cough and fever. EXAM: CHEST - 2 VIEW COMPARISON:  Two-view chest x-ray 05/20/2022 FINDINGS: The heart size and mediastinal contours are within normal limits. Both lungs are clear. The visualized skeletal structures are unremarkable. IMPRESSION: No active cardiopulmonary disease. Electronically Signed   By: San Morelle M.D.   On: 05/28/2022 09:58    Procedures Procedures    Medications Ordered in ED Medications  ibuprofen (ADVIL) tablet 800 mg (800 mg Oral Given 05/28/22 4650)    ED Course/ Medical Decision Making/ A&P                           Medical Decision Making Amount and/or Complexity of Data Reviewed External Data Reviewed: notes. Labs:     Details:  Influenza A test is positive Radiology: ordered and independent interpretation performed.    Details: No lobar pneumonia  Risk Prescription drug management.   Patient is well-appearing here.  Appears to have influenza A which would account for her fevers, body aches, etc.  She is not hypoxic or in distress.  We discussed options, she prefers to start Tamiflu.  Will send this to her pharmacy, recommend NSAIDs and Tylenol, and give return precautions.        Final Clinical Impression(s) / ED Diagnoses Final diagnoses:  Influenza A    Rx / DC Orders ED Discharge Orders          Ordered    oseltamivir (TAMIFLU) 75 MG capsule  Every 12 hours        05/28/22 1029              Sherwood Gambler, MD 05/28/22 1035

## 2022-05-28 NOTE — ED Triage Notes (Signed)
Pt here from home with c/o cough congestion and fever

## 2022-06-05 ENCOUNTER — Emergency Department (HOSPITAL_COMMUNITY)
Admission: EM | Admit: 2022-06-05 | Discharge: 2022-06-05 | Disposition: A | Discharge status: Home / Self Care | Payer: Medicaid Other | Attending: Emergency Medicine | Admitting: Emergency Medicine

## 2022-06-05 ENCOUNTER — Other Ambulatory Visit: Payer: Self-pay

## 2022-06-05 ENCOUNTER — Emergency Department (HOSPITAL_COMMUNITY): Payer: Medicaid Other

## 2022-06-05 DIAGNOSIS — Y908 Blood alcohol level of 240 mg/100 ml or more: Secondary | ICD-10-CM | POA: Diagnosis not present

## 2022-06-05 DIAGNOSIS — W19XXXA Unspecified fall, initial encounter: Secondary | ICD-10-CM | POA: Insufficient documentation

## 2022-06-05 DIAGNOSIS — S0990XA Unspecified injury of head, initial encounter: Secondary | ICD-10-CM | POA: Insufficient documentation

## 2022-06-05 DIAGNOSIS — F10920 Alcohol use, unspecified with intoxication, uncomplicated: Secondary | ICD-10-CM | POA: Diagnosis not present

## 2022-06-05 DIAGNOSIS — Y92007 Garden or yard of unspecified non-institutional (private) residence as the place of occurrence of the external cause: Secondary | ICD-10-CM | POA: Diagnosis not present

## 2022-06-05 LAB — CBC WITH DIFFERENTIAL/PLATELET
Abs Immature Granulocytes: 0.07 10*3/uL (ref 0.00–0.07)
Basophils Absolute: 0 10*3/uL (ref 0.0–0.1)
Basophils Relative: 0 %
Eosinophils Absolute: 0 10*3/uL (ref 0.0–0.5)
Eosinophils Relative: 0 %
HCT: 39.3 % (ref 36.0–46.0)
Hemoglobin: 12.8 g/dL (ref 12.0–15.0)
Immature Granulocytes: 1 %
Lymphocytes Relative: 20 %
Lymphs Abs: 2.1 10*3/uL (ref 0.7–4.0)
MCH: 29.6 pg (ref 26.0–34.0)
MCHC: 32.6 g/dL (ref 30.0–36.0)
MCV: 90.8 fL (ref 80.0–100.0)
Monocytes Absolute: 0.4 10*3/uL (ref 0.1–1.0)
Monocytes Relative: 3 %
Neutro Abs: 7.7 10*3/uL (ref 1.7–7.7)
Neutrophils Relative %: 76 %
Platelets: 264 10*3/uL (ref 150–400)
RBC: 4.33 MIL/uL (ref 3.87–5.11)
RDW: 12.5 % (ref 11.5–15.5)
WBC: 10.3 10*3/uL (ref 4.0–10.5)
nRBC: 0 % (ref 0.0–0.2)

## 2022-06-05 LAB — COMPREHENSIVE METABOLIC PANEL
ALT: 20 U/L (ref 0–44)
AST: 20 U/L (ref 15–41)
Albumin: 4.1 g/dL (ref 3.5–5.0)
Alkaline Phosphatase: 85 U/L (ref 38–126)
Anion gap: 9 (ref 5–15)
BUN: 7 mg/dL (ref 6–20)
CO2: 25 mmol/L (ref 22–32)
Calcium: 9.5 mg/dL (ref 8.9–10.3)
Chloride: 108 mmol/L (ref 98–111)
Creatinine, Ser: 0.62 mg/dL (ref 0.44–1.00)
GFR, Estimated: >60 mL/min (ref >60)
Glucose, Bld: 125 mg/dL — ABNORMAL HIGH (ref 70–99)
Potassium: 4.1 mmol/L (ref 3.5–5.1)
Sodium: 142 mmol/L (ref 135–145)
Total Bilirubin: 0.4 mg/dL (ref 0.3–1.2)
Total Protein: 8.8 g/dL — ABNORMAL HIGH (ref 6.5–8.1)

## 2022-06-05 LAB — I-STAT BETA HCG BLOOD, ED (MC, WL, AP ONLY): I-stat hCG, quantitative: <5 m[IU]/mL (ref <5)

## 2022-06-05 LAB — ETHANOL: Alcohol, Ethyl (B): 277 mg/dL — ABNORMAL HIGH (ref <10)

## 2022-06-05 MED ORDER — KETOROLAC TROMETHAMINE 15 MG/ML IJ SOLN: 15.0000 mg | Freq: Once | INTRAMUSCULAR | Status: DC

## 2022-06-05 MED ORDER — ONDANSETRON HCL 4 MG/2ML IJ SOLN: 4.0000 mg | Freq: Once | INTRAMUSCULAR | Status: DC

## 2022-06-05 MED ORDER — SODIUM CHLORIDE 0.9 % IV BOLUS
1000.0000 mL | Freq: Once | INTRAVENOUS | Status: AC
  Administered 2022-06-05: 1000 mL via INTRAVENOUS

## 2022-06-05 MED ORDER — ONDANSETRON HCL 4 MG PO TABS: 4.0000 mg | ORAL_TABLET | Freq: Four times a day (QID) | ORAL | 0 refills | Status: AC

## 2022-06-05 NOTE — Discharge Instructions (Addendum)
Please decrease your alcohol intake. You may use tylenol and ibuprofen for your pain. I sent nausea medicine to your pharmacy.  It was a pleasure to meet you and we hope you feel better!

## 2022-06-05 NOTE — ED Provider Notes (Signed)
[facility] DEPT Provider Note   CSN: [id number] Arrival date & time: 06/05/22  1645     History  Chief Complaint  Patient presents with   Alcohol Intoxication    [facility] Greer is a 26 y.o. female presenting with alcohol intoxication.  Patient is too intoxicated to provide her own history.  Mother is with her and reports that the patient started drinking tequila with her family earlier this morning.  She reports that she around 2 to 3 hours ago and her daughter was alert and oriented.  Just prior to arrival and neighbor noted that the patient was facedown in the yard, appearing as though she had fallen.  No witnessed fall, patient's family reports that she does not use any drugs.  Apparently does not drink alcohol regularly.  Recently was diagnosed with the flu a week ago   Alcohol Intoxication       Home Medications Prior to Admission medications   Medication Sig Start Date End Date Taking? Authorizing Provider  benzonatate (TESSALON) 100 MG capsule Take 1 capsule (100 mg total) by mouth 3 (three) times daily as needed for cough. 05/28/22   Sherwood Gambler, MD  cyclobenzaprine (FLEXERIL) 10 MG tablet Take 1 tablet (10 mg total) by mouth 3 (three) times daily as needed for muscle spasms. 04/18/21   Molpus, John, MD  medroxyPROGESTERone Acetate 150 MG/ML SUSY Inject 1 mL into the muscle every 3 (three) months. 07/17/19   [provider]  naproxen (NAPROSYN) 375 MG tablet Take 1 tablet twice daily as needed for pain. 04/18/21   Molpus, John, MD  oseltamivir (TAMIFLU) 75 MG capsule Take 1 capsule (75 mg total) by mouth every 12 (twelve) hours. 05/28/22   Sherwood Gambler, MD      Allergies    Amoxicillin    Review of Systems   Review of Systems  Physical Exam Updated Vital Signs BP (!) 92/57 (BP Location: Right Arm)   Pulse 74   Temp (!) 97.3 F (36.3 C) (Oral)   Resp 15   Ht '5\' 5"'$  (1.651 m)   Wt 75 kg   SpO2 97%   BMI 27.51 kg/m   Physical Exam Vitals and nursing note reviewed.  Constitutional:      General: She is not in acute distress.    Appearance: Normal appearance. She is not ill-appearing.  HENT:     Head: Normocephalic and atraumatic.     Mouth/Throat:     Comments: Dried sputum to the side of patient's mouth Eyes:     General: No scleral icterus.    Conjunctiva/sclera: Conjunctivae normal.  Cardiovascular:     Rate and Rhythm: Normal rate and regular rhythm.  Pulmonary:     Effort: Pulmonary effort is normal. No respiratory distress.  Abdominal:     General: Abdomen is flat.     Palpations: Abdomen is soft.     Tenderness: There is no abdominal tenderness.  Skin:    General: Skin is warm and dry.     Findings: No rash.     Comments: No lacerations, abrasions or track marks  Neurological:     Mental Status: She is alert.     Comments: Patient obtunded  Psychiatric:        Mood and Affect: Mood normal.     ED Results / Procedures / Treatments   Labs (all labs ordered are listed, but only abnormal results are displayed) Labs Reviewed  COMPREHENSIVE METABOLIC PANEL - Abnormal; Notable for the following  components:      Result Value   Glucose, Bld 125 (*)    Total Protein 8.8 (*)    All other components within normal limits  ETHANOL - Abnormal; Notable for the following components:   Alcohol, Ethyl (B) 277 (*)    All other components within normal limits  CBC WITH DIFFERENTIAL/PLATELET  URINALYSIS, ROUTINE W REFLEX MICROSCOPIC  RAPID URINE DRUG SCREEN, HOSP PERFORMED  I-STAT BETA HCG BLOOD, ED ([facility], [facility], [facility] ONLY)  CBG MONITORING, ED    EKG None  Radiology No results found.  Procedures Procedures   Medications Ordered in ED Medications  sodium chloride 0.9 % bolus 1,000 mL (has no administration in time range)    ED Course/ Medical Decision Making/ A&P                           Medical Decision Making Amount and/or Complexity of Data Reviewed Labs: ordered. Radiology:  ordered.   26 year old female presenting today after being found down.  Known alcohol intoxication.  Differential includes but is not limited to seizure, syncope, intoxication, overdose, electrolyte abnormality, anemia and CVA.    This is not an exhaustive differential.    Additional history: Mother reports that the patient does not normally drink and that it appears that she drank the entire 750 mL container of tequila this morning   Physical Exam: Pertinent physical exam findings include No lacerations, abrasions or track marks No clear trauma in the oropharynx or in patient's head or neck.  No deformities of any extremities  Lab Tests: I ordered, and personally interpreted labs.  The pertinent results include: Alcohol 277   Imaging Studies: Due to patient's inability to follow exams or answer questions about how she ended up in the yard, CT imaging was ordered.  I viewed and interpreted this and agree that there is no evidence of intracranial abnormality   Cardiac Monitoring:  The patient was maintained on a cardiac monitor.  I viewed and interpreted the cardiac monitored which showed an underlying rhythm of: Sinus   Medications: I ordered medication including fluid bolus. Reevaluation of the patient after these medicines showed that the patient improved.   MDM/Disposition: This is a 26 year old female found down on her porch this morning.  Unsure mechanism of the fall.  Per family she drank around 750 milliliters of tequila this morning, something she usually does not do.  Imaging was negative.  She did not show any signs of deformities or trauma to the rest of her body.  When she first arrived she was unable to communicate.  Mother reports that she got a couple of words out of her and that the patient "knew that she was here."  Lab work benign outside of alcohol of 277.  At this time I believe other etiologies of patient's obtunded state and intoxication have been thoroughly ruled  out.  She is likely suffering from alcohol intoxication.  She will need to remain in the department until she is more alert and oriented and able to ambulate.    9:15pm: Patient is more alert and able to tell me she was drinking this morning because she was happy to be with family for the holidays and getting over the flu. She remembers feeling like she was going to fall. Denies any other drug use.  After patient's work-up today, I feel that she is stable for dc home after ambulating and passing PO challenge.    I  discussed this case with my attending physician Dr. Ronnald Nian who cosigned this note including patient's presenting symptoms, physical exam, and planned diagnostics and interventions. Attending physician stated agreement with plan or made changes to plan which were implemented.      Final Clinical Impression(s) / ED Diagnoses Final diagnoses:  Alcoholic intoxication without complication ([place])    Rx / DC Orders ED Discharge Orders          Ordered    ondansetron (ZOFRAN) 4 MG tablet  Every 6 hours        06/05/22 2118           Results and diagnoses were explained to the patient. Return precautions discussed in full. Patient had no additional questions and expressed complete understanding.   This chart was dictated using voice recognition software.  Despite best efforts to proofread,  errors can occur which can change the documentation meaning.    Rhae Hammock, PA-C 06/05/22 2120    Lennice Sites, DO 06/05/22 2158

## 2022-06-05 NOTE — ED Notes (Signed)
Pt left without receiving discharge papers. Pt left with family. Pt aox4 and ambulatory without assistance upon discharge.

## 2022-06-05 NOTE — ED Notes (Signed)
Pt arrousable to voice upon entering room. Pt then notices her arm and bedsheet were wet. Pt accidentally removed her IV while asleep. Reminded pt to try eating the apple sauce and drinking fluids for her PO challenge. Pt awake and eating apple sauce w/out issue.   Called pt's mother, Maribel Hadley [phone]) to let her know pt will be d/c'd in the next few min and she advised she will start heading this way.

## 2022-06-05 NOTE — ED Triage Notes (Signed)
Pt BIB EMS from home neighbor called after seeing her laying on her porch. Family reported to ems that pt had a fifth of tequila.

## 2022-10-12 ENCOUNTER — Encounter: Payer: Self-pay | Admitting: Nurse Practitioner

## 2022-10-12 ENCOUNTER — Institutional Professional Consult (permissible substitution): Payer: Medicaid Other | Admitting: Nurse Practitioner

## 2023-06-23 IMAGING — CR DG KNEE 1-2V*R*
2 series · 2 of 2 positions shown · non-contrast
Comparison: None.

CLINICAL DATA: Right knee pain

EXAM:
RIGHT KNEE - 1-2 VIEW

[knee ap]
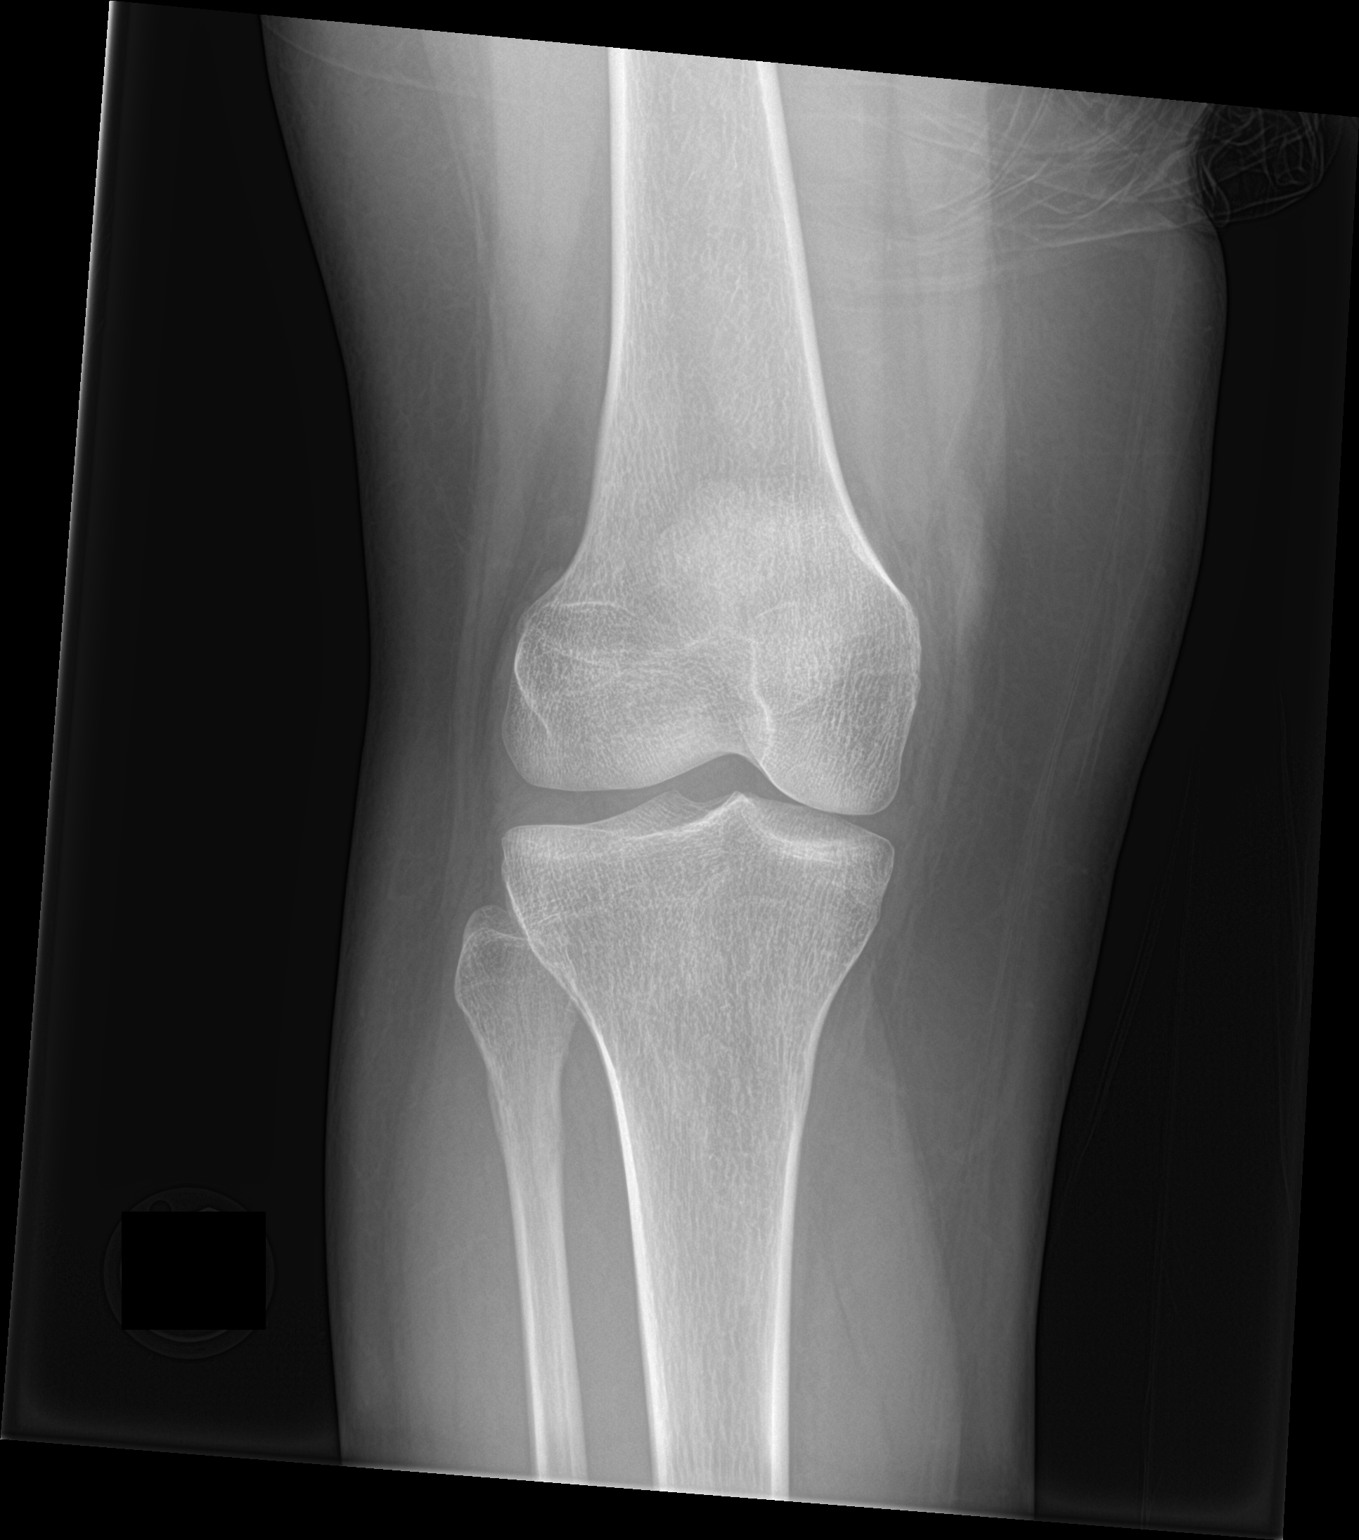

[knee lat]
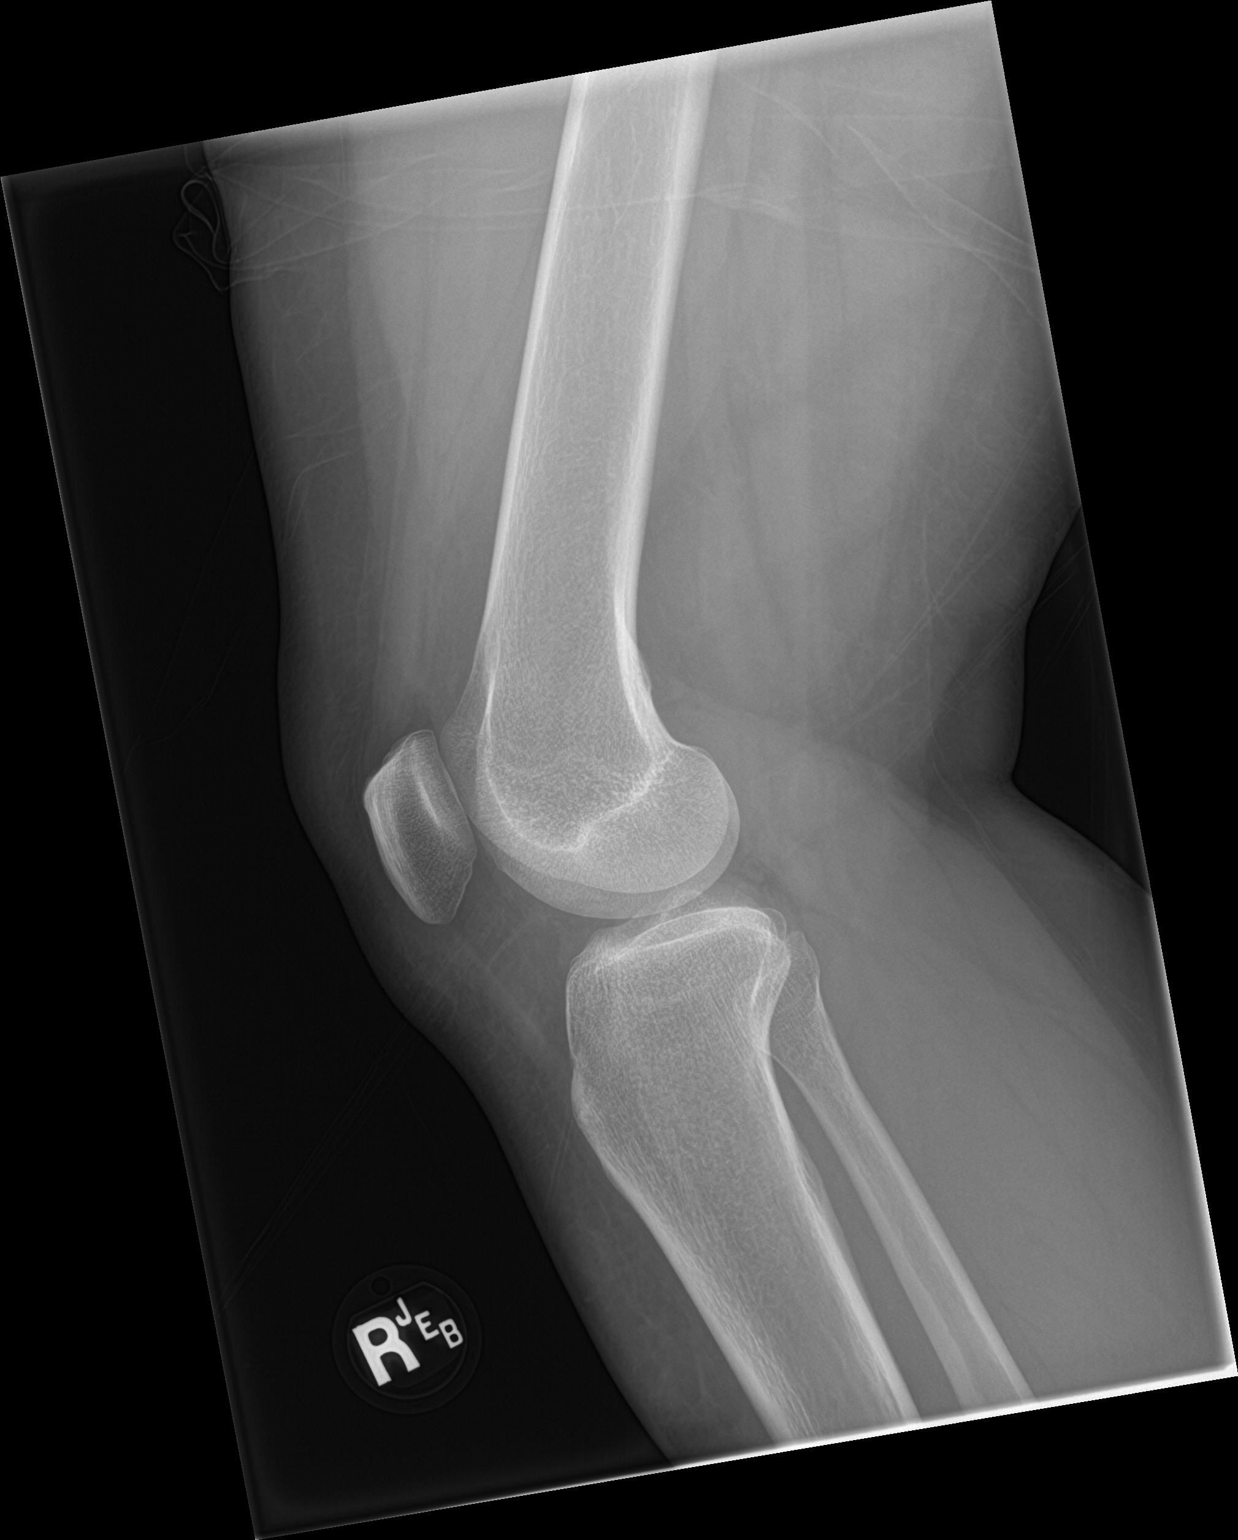

[2 of 2 positions shown; findings below may reference images not displayed]

FINDINGS: No evidence of fracture, dislocation, or joint effusion. No evidence
of arthropathy or other focal bone abnormality. Soft tissues are
unremarkable.
IMPRESSION: Negative.
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
## 2024-07-10 ENCOUNTER — Other Ambulatory Visit: Payer: Self-pay

## 2024-07-10 ENCOUNTER — Encounter (HOSPITAL_COMMUNITY): Payer: Self-pay

## 2024-07-10 ENCOUNTER — Emergency Department (HOSPITAL_COMMUNITY)
Admission: EM | Admit: 2024-07-10 | Discharge: 2024-07-10 | Disposition: A | Discharge status: Home / Self Care | Attending: Emergency Medicine | Admitting: Emergency Medicine

## 2024-07-10 DIAGNOSIS — S29011A Strain of muscle and tendon of front wall of thorax, initial encounter: Secondary | ICD-10-CM | POA: Insufficient documentation

## 2024-07-10 DIAGNOSIS — T148XXA Other injury of unspecified body region, initial encounter: Secondary | ICD-10-CM

## 2024-07-10 DIAGNOSIS — X501XXA Overexertion from prolonged static or awkward postures, initial encounter: Secondary | ICD-10-CM | POA: Insufficient documentation

## 2024-07-10 MED ORDER — LIDOCAINE 5 % EX PTCH: 1.0000 | MEDICATED_PATCH | CUTANEOUS | 0 refills | Status: AC

## 2024-07-10 MED ORDER — METHOCARBAMOL 500 MG PO TABS: 500.0000 mg | ORAL_TABLET | Freq: Two times a day (BID) | ORAL | 0 refills | Status: AC

## 2024-07-10 MED ORDER — KETOROLAC TROMETHAMINE 15 MG/ML IJ SOLN
15.0000 mg | Freq: Once | INTRAMUSCULAR | Status: AC
  Administered 2024-07-10: 15 mg via INTRAMUSCULAR
  Filled 2024-07-10: qty 1

## 2024-07-10 MED ORDER — NAPROXEN 500 MG PO TABS: 500.0000 mg | ORAL_TABLET | Freq: Two times a day (BID) | ORAL | 0 refills | Status: AC

## 2024-07-10 NOTE — ED Triage Notes (Signed)
 Patient feels that she pulled a muscle while doing her hair. Feels a sharp pain under her right armpit. Hard to take a deep breath. Did not feel a pull or pop.

## 2024-07-10 NOTE — Discharge Instructions (Signed)
 You were seen today for likely muscle strain.  Your physical exam and EKG were very reassuring that low suspicion for any emergent cause recent today.  Would recommend continued follow-up with your PCP if pain is persistent.  Return to ED if having new or worsening symptoms which include shortness of breath at rest, fever, uncontrolled pain, leg swelling.  Otherwise continue to use symptom management at home.  Please take Naprosyn , 500mg  by mouth twice daily as needed for pain - this in an antiinflammatory medicine (NSAID) and is similar to ibuprofen  - many people feel that it is stronger than ibuprofen  and it is easier to take since it is a smaller pill.  Please use this only for 1 week - if your pain persists, you will need to follow up with your doctor in the office for ongoing guidance and pain control.   Please take Robaxin , 500 mg up to twice a day as needed for muscle spasm, this is a muscle relaxer, it may cause generalized weakness, sleepiness and you should not drive or do important things while taking this medication. Additionally you can use Tylenol  with this as well as lidocaine  patches which I have sent into the pharmacy for you.
